# Patient Record
Sex: Female | Born: 1955
Health system: Southern US, Community
[De-identification: ages and names within clinical notes are randomized; demographics above are authoritative.]

## PROBLEM LIST (undated history)

## (undated) DIAGNOSIS — I10 Essential (primary) hypertension: Secondary | ICD-10-CM

## (undated) DIAGNOSIS — K219 Gastro-esophageal reflux disease without esophagitis: Secondary | ICD-10-CM

## (undated) DIAGNOSIS — E039 Hypothyroidism, unspecified: Secondary | ICD-10-CM

## (undated) DIAGNOSIS — K259 Gastric ulcer, unspecified as acute or chronic, without hemorrhage or perforation: Secondary | ICD-10-CM

## (undated) DIAGNOSIS — N809 Endometriosis, unspecified: Secondary | ICD-10-CM

## (undated) DIAGNOSIS — G039 Meningitis, unspecified: Secondary | ICD-10-CM

## (undated) DIAGNOSIS — K5790 Diverticulosis of intestine, part unspecified, without perforation or abscess without bleeding: Secondary | ICD-10-CM

## (undated) DIAGNOSIS — K635 Polyp of colon: Secondary | ICD-10-CM

## (undated) HISTORY — DX: Diverticulosis of intestine, part unspecified, without perforation or abscess without bleeding: K57.90

## (undated) HISTORY — PX: BACK SURGERY: SHX140

## (undated) HISTORY — DX: Gastric ulcer, unspecified as acute or chronic, without hemorrhage or perforation: K25.9

## (undated) HISTORY — PX: DILATION AND CURETTAGE OF UTERUS: SHX78

## (undated) HISTORY — DX: Meningitis, unspecified: G03.9

## (undated) HISTORY — PX: HERNIA REPAIR: SHX51

## (undated) HISTORY — DX: Endometriosis, unspecified: N80.9

## (undated) HISTORY — PX: ABDOMINAL HYSTERECTOMY: SHX81

## (undated) HISTORY — PX: CARPAL TUNNEL RELEASE: SHX101

## (undated) HISTORY — PX: OTHER SURGICAL HISTORY: SHX169

## (undated) HISTORY — DX: Polyp of colon: K63.5

## (undated) HISTORY — PX: CHOLECYSTECTOMY: SHX55

## (undated) HISTORY — PX: BRAIN SURGERY: SHX531

---

## 1998-03-08 ENCOUNTER — Inpatient Hospital Stay (HOSPITAL_COMMUNITY): Admission: RE | Admit: 1998-03-08 | Discharge: 1998-03-09 | Payer: Self-pay | Admitting: Neurosurgery

## 1999-05-04 ENCOUNTER — Encounter: Payer: Self-pay | Admitting: Neurosurgery

## 1999-05-04 ENCOUNTER — Inpatient Hospital Stay (HOSPITAL_COMMUNITY): Admission: EM | Admit: 1999-05-04 | Discharge: 1999-05-06 | Payer: Self-pay | Admitting: Emergency Medicine

## 1999-05-04 ENCOUNTER — Encounter: Payer: Self-pay | Admitting: Emergency Medicine

## 1999-05-06 ENCOUNTER — Encounter: Payer: Self-pay | Admitting: Neurosurgery

## 1999-05-09 ENCOUNTER — Inpatient Hospital Stay (HOSPITAL_COMMUNITY): Admission: RE | Admit: 1999-05-09 | Discharge: 1999-05-16 | Payer: Self-pay | Admitting: Neurosurgery

## 2001-06-13 ENCOUNTER — Other Ambulatory Visit: Admission: RE | Admit: 2001-06-13 | Discharge: 2001-06-13 | Payer: Self-pay | Admitting: Obstetrics and Gynecology

## 2002-08-21 ENCOUNTER — Other Ambulatory Visit: Admission: RE | Admit: 2002-08-21 | Discharge: 2002-08-21 | Payer: Self-pay | Admitting: Gynecology

## 2002-08-25 ENCOUNTER — Encounter: Admission: RE | Admit: 2002-08-25 | Discharge: 2002-08-25 | Payer: Self-pay | Admitting: Gynecology

## 2002-08-25 ENCOUNTER — Encounter: Payer: Self-pay | Admitting: Gynecology

## 2003-08-28 ENCOUNTER — Other Ambulatory Visit: Admission: RE | Admit: 2003-08-28 | Discharge: 2003-08-28 | Payer: Self-pay | Admitting: Gynecology

## 2005-01-13 ENCOUNTER — Ambulatory Visit: Payer: Self-pay | Admitting: Internal Medicine

## 2005-03-29 ENCOUNTER — Encounter: Admission: RE | Admit: 2005-03-29 | Discharge: 2005-03-29 | Payer: Self-pay | Admitting: Sports Medicine

## 2005-12-29 ENCOUNTER — Ambulatory Visit (HOSPITAL_COMMUNITY): Admission: RE | Admit: 2005-12-29 | Discharge: 2005-12-29 | Payer: Self-pay | Admitting: Family Medicine

## 2006-02-03 ENCOUNTER — Other Ambulatory Visit: Admission: RE | Admit: 2006-02-03 | Discharge: 2006-02-03 | Payer: Self-pay | Admitting: Gynecology

## 2006-02-26 ENCOUNTER — Encounter: Admission: RE | Admit: 2006-02-26 | Discharge: 2006-02-26 | Payer: Self-pay | Admitting: Gynecology

## 2006-03-19 ENCOUNTER — Ambulatory Visit (HOSPITAL_COMMUNITY): Admission: RE | Admit: 2006-03-19 | Discharge: 2006-03-19 | Payer: Self-pay | Admitting: *Deleted

## 2006-06-23 ENCOUNTER — Ambulatory Visit (HOSPITAL_COMMUNITY): Admission: RE | Admit: 2006-06-23 | Discharge: 2006-06-23 | Payer: Self-pay | Admitting: Family Medicine

## 2006-06-24 ENCOUNTER — Ambulatory Visit (HOSPITAL_COMMUNITY): Admission: RE | Admit: 2006-06-24 | Discharge: 2006-06-24 | Payer: Self-pay | Admitting: Family Medicine

## 2008-02-29 ENCOUNTER — Ambulatory Visit (HOSPITAL_COMMUNITY): Admission: RE | Admit: 2008-02-29 | Discharge: 2008-02-29 | Payer: Self-pay | Admitting: Obstetrics & Gynecology

## 2009-07-31 ENCOUNTER — Ambulatory Visit (HOSPITAL_COMMUNITY): Admission: RE | Admit: 2009-07-31 | Discharge: 2009-07-31 | Payer: Self-pay | Admitting: Obstetrics & Gynecology

## 2010-08-26 ENCOUNTER — Ambulatory Visit (HOSPITAL_COMMUNITY)
Admission: RE | Admit: 2010-08-26 | Discharge: 2010-08-26 | Payer: Self-pay | Source: Home / Self Care | Attending: Obstetrics & Gynecology | Admitting: Obstetrics & Gynecology

## 2010-08-31 DIAGNOSIS — K635 Polyp of colon: Secondary | ICD-10-CM

## 2010-08-31 HISTORY — DX: Polyp of colon: K63.5

## 2010-09-21 ENCOUNTER — Encounter: Payer: Self-pay | Admitting: Gynecology

## 2011-01-16 NOTE — Op Note (Signed)
[name], Rebecca Burke              ACCOUNT NO.:  [id number]   MEDICAL RECORD NO.:  [id number]          PATIENT TYPE:  AMB   LOCATION:  SDS                          FACILITY:  [facility]   PHYSICIAN:  Alfonse Ras, MD   DATE OF BIRTH:  28-Nov-1955   DATE OF PROCEDURE:  03/19/2006  DATE OF DISCHARGE:  03/19/2006                                 OPERATIVE REPORT   SURGEON:  Alfonse Ras, MD.   PREOPERATIVE DIAGNOSIS:  Umbilical hernia.   POSTOPERATIVE DIAGNOSIS:  Umbilical hernia.   PROCEDURE:  Umbilical hernia repair.   NOTE:  This is a repeat dictation from 03/19/06; repeat dictation on 04/14/06.   ANESTHESIA:  General.   DESCRIPTION:  The patient was taken to the operating room, placed in a  supine position.  After adequate general anesthesia was induced and he was  in a laryngeal mask, a transverse infraumbilical incision was made.  The  hernia sac was dissected free from the fascial edges and reduced into the  abdominal cavity, and the fascia was closed with interrupted #1 Surgilon.  The defect was no bigger than my small finger; and, therefore, I did not  apply any mesh.  The skin was closed with subcuticular 4-0 Monocryl.  Steri-  Strips and sterile dressings were applied.  The patient tolerated the  procedure well and went to the PACU in good condition.      Alfonse Ras, MD  Electronically Signed     KRE/MEDQ  D:  04/14/2006  T:  04/14/2006  Job:  [id number]

## 2011-01-16 NOTE — Op Note (Signed)
Rebecca Burke, Rebecca Burke              ACCOUNT NO.:  [id number]   MEDICAL RECORD NO.:  [id number]          PATIENT TYPE:  AMB   LOCATION:  SDS                          FACILITY:  [facility]   PHYSICIAN:  Alfonse Ras, MD   DATE OF BIRTH:  Jun 12, 1956   DATE OF PROCEDURE:  03/19/2006  DATE OF DISCHARGE:                                 OPERATIVE REPORT   PREOPERATIVE DIAGNOSIS:  Umbilical hernia.   POSTOPERATIVE DIAGNOSIS:  Umbilical hernia.   PROCEDURE:  Umbilical hernia repair.   SURGEON:  Alfonse Ras, M.D.   ANESTHESIA:  General.   DESCRIPTION:  The patient was taken to the operating room, placed in the  supine position after adequate anesthesia was induced.  The abdomen was  prepped and draped in normal sterile fashion.  Through the previous scar in  the lower abdomen just under the umbilicus a transverse incision was made.  Hernia sac was immediately encountered and was dissected off the fascia.  The fascial defect actually barely allowed my fifth finger tip through it  was so small.  The omentum was carefully reduced into the abdominal cavity,  and the fascia was closed with two figure-of-eight 0 Surgilon sutures.  I  did not think that with the size of this defect she needed mesh, and  therefore, the skin was closed primarily with 4-0 running Monocryl  subcuticularly.  Sterile dressing was applied.  The patient tolerated the  procedure well and went to PACU in good condition.      Alfonse Ras, MD  Electronically Signed     KRE/MEDQ  D:  03/19/2006  T:  03/19/2006  Job:  [id number]

## 2011-07-21 ENCOUNTER — Other Ambulatory Visit: Payer: Self-pay | Admitting: Obstetrics & Gynecology

## 2011-07-21 DIAGNOSIS — Z139 Encounter for screening, unspecified: Secondary | ICD-10-CM

## 2011-08-05 ENCOUNTER — Encounter: Payer: Self-pay | Admitting: Emergency Medicine

## 2011-08-05 ENCOUNTER — Emergency Department (HOSPITAL_COMMUNITY)
Admission: EM | Admit: 2011-08-05 | Discharge: 2011-08-06 | Disposition: A | Discharge status: Home / Self Care | Payer: BC Managed Care – PPO | Attending: Emergency Medicine | Admitting: Emergency Medicine

## 2011-08-05 DIAGNOSIS — K529 Noninfective gastroenteritis and colitis, unspecified: Secondary | ICD-10-CM

## 2011-08-05 DIAGNOSIS — E876 Hypokalemia: Secondary | ICD-10-CM | POA: Insufficient documentation

## 2011-08-05 DIAGNOSIS — R7401 Elevation of levels of liver transaminase levels: Secondary | ICD-10-CM | POA: Insufficient documentation

## 2011-08-05 DIAGNOSIS — N390 Urinary tract infection, site not specified: Secondary | ICD-10-CM | POA: Insufficient documentation

## 2011-08-05 DIAGNOSIS — R109 Unspecified abdominal pain: Secondary | ICD-10-CM | POA: Insufficient documentation

## 2011-08-05 DIAGNOSIS — R7402 Elevation of levels of lactic acid dehydrogenase (LDH): Secondary | ICD-10-CM | POA: Insufficient documentation

## 2011-08-05 DIAGNOSIS — K5289 Other specified noninfective gastroenteritis and colitis: Secondary | ICD-10-CM | POA: Insufficient documentation

## 2011-08-05 HISTORY — DX: Essential (primary) hypertension: I10

## 2011-08-05 NOTE — ED Notes (Signed)
Patient c/o abdominal cramping with N/V/D.  States was seen at Urgent Care today and told she had a stomach virus.  Patient c/o continued pain and cramping in her abdomen with associated nausea.

## 2011-08-06 ENCOUNTER — Emergency Department (HOSPITAL_COMMUNITY): Payer: BC Managed Care – PPO

## 2011-08-06 LAB — CBC
HCT: 39.6 % (ref 36.0–46.0)
Hemoglobin: 13.5 g/dL (ref 12.0–15.0)
MCV: 84.8 fL (ref 78.0–100.0)
RDW: 13.1 % (ref 11.5–15.5)
WBC: 8 10*3/uL (ref 4.0–10.5)

## 2011-08-06 LAB — COMPREHENSIVE METABOLIC PANEL
BUN: 23 mg/dL (ref 6–23)
CO2: 25 mEq/L (ref 19–32)
Calcium: 9.1 mg/dL (ref 8.4–10.5)
Creatinine, Ser: 0.68 mg/dL (ref 0.50–1.10)
GFR calc Af Amer: >90 mL/min (ref >90)
GFR calc non Af Amer: >90 mL/min (ref >90)
Glucose, Bld: 176 mg/dL — ABNORMAL HIGH (ref 70–99)
Total Bilirubin: 0.5 mg/dL (ref 0.3–1.2)

## 2011-08-06 LAB — DIFFERENTIAL
Basophils Absolute: 0 10*3/uL (ref 0.0–0.1)
Eosinophils Relative: 0 % (ref 0–5)
Lymphocytes Relative: 16 % (ref 12–46)
Lymphs Abs: 1.3 10*3/uL (ref 0.7–4.0)
Monocytes Absolute: 0.6 10*3/uL (ref 0.1–1.0)
Monocytes Relative: 8 % (ref 3–12)

## 2011-08-06 LAB — URINALYSIS, ROUTINE W REFLEX MICROSCOPIC
Glucose, UA: NEGATIVE mg/dL
Ketones, ur: NEGATIVE mg/dL
Protein, ur: NEGATIVE mg/dL

## 2011-08-06 LAB — LIPASE, BLOOD: Lipase: 17 U/L (ref 11–59)

## 2011-08-06 LAB — URINE MICROSCOPIC-ADD ON

## 2011-08-06 LAB — LACTIC ACID, PLASMA: Lactic Acid, Venous: 1.4 mmol/L (ref 0.5–2.2)

## 2011-08-06 MED ORDER — SODIUM CHLORIDE 0.9 % IV BOLUS (SEPSIS)
1000.0000 mL | Freq: Once | INTRAVENOUS | Status: AC
  Administered 2011-08-06: 1000 mL via INTRAVENOUS

## 2011-08-06 MED ORDER — POTASSIUM CHLORIDE ER 10 MEQ PO TBCR: 10.0000 meq | EXTENDED_RELEASE_TABLET | Freq: Two times a day (BID) | ORAL | Status: DC

## 2011-08-06 MED ORDER — ONDANSETRON HCL 4 MG PO TABS: 4.0000 mg | ORAL_TABLET | Freq: Four times a day (QID) | ORAL | Status: AC

## 2011-08-06 MED ORDER — MORPHINE SULFATE 4 MG/ML IJ SOLN
4.0000 mg | Freq: Once | INTRAMUSCULAR | Status: AC
  Administered 2011-08-06: 4 mg via INTRAVENOUS
  Filled 2011-08-06: qty 1

## 2011-08-06 MED ORDER — IOHEXOL 300 MG/ML  SOLN
100.0000 mL | Freq: Once | INTRAMUSCULAR | Status: AC | PRN
  Administered 2011-08-06: 100 mL via INTRAVENOUS

## 2011-08-06 MED ORDER — CEPHALEXIN 500 MG PO CAPS: 500.0000 mg | ORAL_CAPSULE | Freq: Four times a day (QID) | ORAL | Status: AC

## 2011-08-06 MED ORDER — TRAMADOL HCL 50 MG PO TABS: 50.0000 mg | ORAL_TABLET | Freq: Four times a day (QID) | ORAL | Status: AC | PRN

## 2011-08-06 MED ORDER — HYDROMORPHONE HCL PF 1 MG/ML IJ SOLN
1.0000 mg | Freq: Once | INTRAMUSCULAR | Status: AC
  Administered 2011-08-06: 1 mg via INTRAVENOUS
  Filled 2011-08-06: qty 1

## 2011-08-06 MED ORDER — ONDANSETRON HCL 4 MG/2ML IJ SOLN
4.0000 mg | Freq: Once | INTRAMUSCULAR | Status: AC
  Administered 2011-08-06: 4 mg via INTRAVENOUS
  Filled 2011-08-06: qty 2

## 2011-08-06 NOTE — ED Notes (Signed)
MD at bedside. 

## 2011-08-06 NOTE — ED Notes (Signed)
Medicated as ordered per MD. Denies any needs at this time. Husband at bedside. Call bell within reach. Bed in low position and locked with side rails up.

## 2011-08-06 NOTE — ED Notes (Signed)
Patient back to room from radiology. Pain 0\10. Denies nausea. Normal saline complete. Iv saline locked with no signs of infiltration. Call bell and husband at bedside. Will continue to monitor.

## 2011-08-06 NOTE — ED Notes (Signed)
Remains resting in bed on right side. Notified no new medication orders have been written but MD is aware. Verbalized understanding. Denies any needs. Call bell within reach. Will continue to monitor.

## 2011-08-06 NOTE — ED Notes (Signed)
Given one cup water for PO challenge. States pain is about a 2\10. Denies nausea. Denies any needs at this time. Call bell within reach. Will continue to monitor.

## 2011-08-06 NOTE — ED Notes (Signed)
Remains resting sitting up in bed. Denies pain or nausea. Call bell within reach. Husband with patient. Equal chest rise and fall. Denies needs.

## 2011-08-06 NOTE — ED Notes (Signed)
Tolerated PO challenge well. Resting comfortably on right side. Equal chest rise and fall. No distress. Call bell within reach.

## 2011-08-06 NOTE — ED Notes (Signed)
Into room to attempt iv access and draw labs. 

## 2011-08-06 NOTE — ED Notes (Signed)
Patient ambulatory to bathroom without assistance to obtain urine specimen via clean catch.

## 2011-08-06 NOTE — ED Notes (Signed)
Pain remains 7\10. Denies nausea. Resting in bed on right side. Denies any needs other than something else for pain. Call bell and husband at bedside. Will continue to monitor. md aware.

## 2011-08-06 NOTE — ED Notes (Signed)
Patient to radiology via stretcher

## 2011-08-06 NOTE — ED Notes (Signed)
Remains resting in bed on back. Eyes closed. No facial grimaces. Equal chest rise and fall. Call bell within reach. No distress. Husband at bedside.

## 2011-08-06 NOTE — ED Notes (Signed)
Into room to assess patient. Resting in bed on right side. States she has been having middle upper abdominal pain since Tuesday. Tuesday she had two episodes of vomiting but no more to follow. Has also had frequent episodes of diarrhea. Bowel sounds active in all quadrants. Tenderness upon palpation of middle upper abdomen. States it is a cramping\ spasming pain that comes and goes. Tonight was her last BM which was loose. Last food\ fluid intake at 1930 and tolerated well. Denies any needs at this time. Awaiting MD eval.

## 2011-08-06 NOTE — ED Provider Notes (Signed)
History     CSN: [id number] Arrival date & time: 08/05/2011 11:38 PM   First MD Initiated Contact with Patient 08/05/11 2355      Chief Complaint  Patient presents with  . Abdominal Cramping    (Consider location/radiation/quality/duration/timing/severity/associated sxs/prior treatment) HPI Comments: Patient presents with 2 days of abdominal cramping, nausea, vomiting diarrhea. She was seen in urgent care today and told that she had a stomach virus. She returns with worsening abdominal pain and nausea. She's not vomited for more than 24 hours but she still having frequent loose stools. She denies any chest pain, shortness of breath, fever chills. She has any urinary or vaginal symptoms. She denies any sick contacts.  The history is provided by the patient.    Past Medical History  Diagnosis Date  . Diabetes mellitus   . Hypertension     Past Surgical History  Procedure Date  . Back surgery   . Brain surgery   . Abdominal hysterectomy   . Cholecystectomy   . Hernia repair     No family history on file.  History  Substance Use Topics  . Smoking status: Former Games developer  . Smokeless tobacco: Not on file  . Alcohol Use: No    OB History    Grav Para Term Preterm Abortions TAB SAB Ect Mult Living                  Review of Systems  All other systems reviewed and are negative.    Allergies  Codeine and Sulfa antibiotics  Home Medications   Current Outpatient Rx  Name Route Sig Dispense Refill  . ACETAMINOPHEN 500 MG PO TABS Oral Take 500 mg by mouth as needed. For pain     . AMLODIPINE BESYLATE 10 MG PO TABS Oral Take 10 mg by mouth daily.      Marland Kitchen DIPHENOXYLATE-ATROPINE 2.5-0.025 MG PO TABS Oral Take 1 tablet by mouth 4 (four) times daily as needed. For diarrhea     . IBUPROFEN 200 MG PO TABS Oral Take 200 mg by mouth 3 (three) times daily as needed. For pain     . LOSARTAN POTASSIUM 100 MG PO TABS Oral Take 100 mg by mouth daily.      Marland Kitchen METFORMIN HCL 500 MG PO  TABS Oral Take 500 mg by mouth 2 (two) times daily with a meal.      . MONTELUKAST SODIUM 10 MG PO TABS Oral Take 10 mg by mouth at bedtime.      . OMEPRAZOLE 20 MG PO CPDR Oral Take 20 mg by mouth daily.      . CEPHALEXIN 500 MG PO CAPS Oral Take 1 capsule (500 mg total) by mouth 4 (four) times daily. 40 capsule 0  . ONDANSETRON HCL 4 MG PO TABS Oral Take 1 tablet (4 mg total) by mouth every 6 (six) hours. 12 tablet 0  . POTASSIUM CHLORIDE CR 10 MEQ PO TBCR Oral Take 1 tablet (10 mEq total) by mouth 2 (two) times daily. 6 tablet 0  . TRAMADOL HCL 50 MG PO TABS Oral Take 1 tablet (50 mg total) by mouth every 6 (six) hours as needed for pain. Maximum dose= 8 tablets per day 15 tablet 0    BP 113/68  Pulse 76  Temp(Src) 98.1 F (36.7 C) (Oral)  Resp 16  Ht 5\' 5"  (1.651 m)  Wt 212 lb (96.163 kg)  BMI 35.28 kg/m2  SpO2 96%  Physical Exam  Constitutional: She is oriented  to person, place, and time. She appears well-developed and well-nourished. No distress.  HENT:  Head: Normocephalic and atraumatic.  Mouth/Throat: Oropharynx is clear and moist.  Eyes: Conjunctivae are normal. Pupils are equal, round, and reactive to light.  Neck: Normal range of motion.  Cardiovascular: Normal rate, regular rhythm and normal heart sounds.   Pulmonary/Chest: Effort normal and breath sounds normal. No respiratory distress.  Abdominal: Soft. There is tenderness. There is no rebound and no guarding.       Diffusely tender with guarding  Musculoskeletal: Normal range of motion. She exhibits no edema and no tenderness.  Neurological: She is alert and oriented to person, place, and time. No cranial nerve deficit.  Skin: Skin is warm.    ED Course  Procedures (including critical care time)  Labs Reviewed  URINALYSIS, ROUTINE W REFLEX MICROSCOPIC - Abnormal; Notable for the following:    Color, Urine AMBER (*) BIOCHEMICALS MAY BE AFFECTED BY COLOR   Specific Gravity, Urine >1.030 (*)    Hgb urine  dipstick TRACE (*)    Bilirubin Urine SMALL (*)    All other components within normal limits  COMPREHENSIVE METABOLIC PANEL - Abnormal; Notable for the following:    Potassium 3.2 (*)    Glucose, Bld 176 (*)    AST 225 (*)    ALT 165 (*)    All other components within normal limits  URINE MICROSCOPIC-ADD ON - Abnormal; Notable for the following:    Squamous Epithelial / LPF FEW (*)    Bacteria, UA MANY (*)    All other components within normal limits  CBC  DIFFERENTIAL  LIPASE, BLOOD  LACTIC ACID, PLASMA   Ct Abdomen Pelvis W Contrast  08/06/2011  *RADIOLOGY REPORT*  Clinical Data: Abdominal pain, vomiting and diarrhea; abdominal cramping.  CT ABDOMEN AND PELVIS WITH CONTRAST  Technique:  Multidetector CT imaging of the abdomen and pelvis was performed following the standard protocol during bolus administration of intravenous contrast.  Contrast: OMNIPAQUE IOHEXOL 300 MG/ML IV SOLN  Comparison: None.  Findings: Mild bibasilar atelectasis is noted.  The diffusely decreased attenuation of the liver, even in the presence of contrast, is suspicious for diffuse fatty infiltration. The liver is otherwise unremarkable in appearance.  The spleen is normal in appearance.  The pancreas and adrenal glands are unremarkable.  Nonspecific perinephric stranding is noted bilaterally.  The kidneys are otherwise unremarkable in appearance.  There is no evidence of hydronephrosis.  No renal or ureteral stones are seen.  No free fluid is identified.  The small bowel is unremarkable in appearance.  The stomach is filled with contrast and is within normal limits.  No acute vascular abnormalities are seen.  The appendix is normal in caliber, without evidence for appendicitis.  The colon is partially filled with stool and is unremarkable in appearance.  The bladder is mildly distended appears grossly unremarkable.  The patient is status post hysterectomy; no suspicious adnexal masses are seen.  The ovaries appear  relatively symmetric.  No inguinal lymphadenopathy is seen.  No acute osseous abnormalities are identified.  Subcortical cystic change at the left femoral head reflects chronic degenerative change.  IMPRESSION:  1.  No acute abnormalities identified within the abdomen or pelvis. 2.  Diffuse fatty infiltration of the liver. 3.  Mild degenerative change noted at the left hip, without significant joint space narrowing. 4.  Mild bibasilar atelectasis noted.  Original Report Authenticated By: Tonia Ghent, M.D.     1. Gastroenteritis   2.  Hypokalemia   3. Urinary tract infection       MDM  Abdominal pain, nausea, vomiting, diarrhea. Vital Signs stable abdomen is soft but diffusely tender.  We'll obtain labs, treat symptoms and consider imaging.  Concentrated urine with many bacteria. Labs remarkable for slight elevation of transaminases, slight hypokalemia. CT negative for acute process   Patient tolerating by mouth without difficulty. We'll discharge with antiemetics, potassium replacement and UTI treatment.  Glynn Octave, MD 08/06/11 [phone]

## 2011-08-06 NOTE — ED Notes (Signed)
Denies any pain. Denies nausea. Sitting up in bed. Tolerating contrast well. Call bell within reach. Denies any needs. No distress.

## 2011-08-06 NOTE — ED Notes (Signed)
Medicated as ordered for 7\10 pain. Patient sitting up drinking contrast. Tolerating well. Denies nausea. Call bell within reach. Husband at bedside.

## 2011-08-12 ENCOUNTER — Telehealth: Payer: Self-pay

## 2011-08-13 ENCOUNTER — Other Ambulatory Visit: Payer: Self-pay

## 2011-08-13 DIAGNOSIS — Z139 Encounter for screening, unspecified: Secondary | ICD-10-CM

## 2011-08-13 NOTE — Telephone Encounter (Signed)
Rx and instructions mailed.  

## 2011-08-13 NOTE — Telephone Encounter (Signed)
Gastroenterology Pre-Procedure Form  Pt trying very hard to get colonoscopy before end of the year. This is late afternoon appt. Can we change her  Prep instructions for diabetic .   Request Date: 08/28/2011      Requesting Physician: Dr. Regino Schultze    PATIENT INFORMATION:  Rebecca Burke is a 55 y.o., female (DOB=01/01/1956).  PROCEDURE: Procedure(s) requested: colonoscopy Procedure Reason: screening for colon cancer  PATIENT REVIEW QUESTIONS: The patient reports the following:   1. Diabetes Melitis: yes  2. Joint replacements in the past 12 months: no 3. Major health problems in the past 3 months: no 4. Has an artificial valve or MVP:no 5. Has been advised in past to take antibiotics in advance of a procedure like teeth cleaning: no}    MEDICATIONS & ALLERGIES:    Patient reports the following regarding taking any blood thinners:   Plavix? no Aspirin?no Coumadin?  no  Patient confirms/reports the following medications:  Current Outpatient Prescriptions  Medication Sig Dispense Refill  . amLODipine (NORVASC) 10 MG tablet Take 10 mg by mouth daily.        . cephALEXin (KEFLEX) 500 MG capsule Take 1 capsule (500 mg total) by mouth 4 (four) times daily.  40 capsule  0  . ibuprofen (ADVIL,MOTRIN) 200 MG tablet Take 200 mg by mouth 3 (three) times daily as needed. For pain       . losartan (COZAAR) 100 MG tablet Take 100 mg by mouth daily.        . metFORMIN (GLUCOPHAGE) 500 MG tablet Take 500 mg by mouth 2 (two) times daily with a meal.        . omeprazole (PRILOSEC) 20 MG capsule Take 20 mg by mouth daily.        Marland Kitchen acetaminophen (TYLENOL) 500 MG tablet Take 500 mg by mouth as needed. For pain       . diphenoxylate-atropine (LOMOTIL) 2.5-0.025 MG per tablet Take 1 tablet by mouth 4 (four) times daily as needed. For diarrhea       . montelukast (SINGULAIR) 10 MG tablet Take 10 mg by mouth at bedtime.        . ondansetron (ZOFRAN) 4 MG tablet Take 1 tablet (4 mg total) by mouth  every 6 (six) hours.  12 tablet  0  . potassium chloride (K-DUR) 10 MEQ tablet Take 1 tablet (10 mEq total) by mouth 2 (two) times daily.  6 tablet  0  . traMADol (ULTRAM) 50 MG tablet Take 1 tablet (50 mg total) by mouth every 6 (six) hours as needed for pain. Maximum dose= 8 tablets per day  15 tablet  0    Patient confirms/reports the following allergies:  Allergies  Allergen Reactions  . Codeine   . Sulfa Antibiotics     Patient is appropriate to schedule for requested procedure(s): yes  AUTHORIZATION INFORMATION Primary Insurance:   ID #:   Group #:  Pre-Cert / Auth required: } Pre-Cert / Auth #:   Secondary Insurance:   ID #:  Group #:  Pre-Cert / Auth required Pre-Cert / Auth #:   No orders of the defined types were placed in this encounter.    SCHEDULE INFORMATION: Procedure has been scheduled as follows:  Date: 08/28/2011       Time: 2:45 PM  Location: [facility] Short Stay  This Gastroenterology Pre-Precedure Form is being routed to the following provider(s) for review: R. Roetta Sessions, MD

## 2011-08-13 NOTE — Telephone Encounter (Signed)
OK to proceed with colonoscopy.

## 2011-08-18 ENCOUNTER — Encounter (HOSPITAL_COMMUNITY): Payer: Self-pay | Admitting: Pharmacy Technician

## 2011-08-27 ENCOUNTER — Telehealth: Payer: Self-pay | Admitting: Gastroenterology

## 2011-08-27 MED ORDER — SODIUM CHLORIDE 0.45 % IV SOLN
Freq: Once | INTRAVENOUS | Status: AC
  Administered 2011-08-28: 10:00:00 via INTRAVENOUS

## 2011-08-27 NOTE — Telephone Encounter (Signed)
PT PROCEDURE TIME MOVED UP TO 10AM- PT AWARE OF NEW ARRIVAL TIME

## 2011-08-28 ENCOUNTER — Ambulatory Visit (HOSPITAL_COMMUNITY)
Admission: RE | Admit: 2011-08-28 | Discharge: 2011-08-28 | Disposition: A | Discharge status: Home / Self Care | Payer: BC Managed Care – PPO | Source: Ambulatory Visit | Attending: Internal Medicine | Admitting: Internal Medicine

## 2011-08-28 ENCOUNTER — Encounter (HOSPITAL_COMMUNITY)
Admission: RE | Disposition: A | Discharge status: Home / Self Care | Payer: Self-pay | Source: Ambulatory Visit | Attending: Internal Medicine

## 2011-08-28 ENCOUNTER — Other Ambulatory Visit: Payer: Self-pay | Admitting: Internal Medicine

## 2011-08-28 ENCOUNTER — Encounter (HOSPITAL_COMMUNITY): Payer: Self-pay | Admitting: *Deleted

## 2011-08-28 DIAGNOSIS — D126 Benign neoplasm of colon, unspecified: Secondary | ICD-10-CM | POA: Insufficient documentation

## 2011-08-28 DIAGNOSIS — E119 Type 2 diabetes mellitus without complications: Secondary | ICD-10-CM | POA: Insufficient documentation

## 2011-08-28 DIAGNOSIS — Z1211 Encounter for screening for malignant neoplasm of colon: Secondary | ICD-10-CM

## 2011-08-28 DIAGNOSIS — Z139 Encounter for screening, unspecified: Secondary | ICD-10-CM

## 2011-08-28 DIAGNOSIS — I10 Essential (primary) hypertension: Secondary | ICD-10-CM | POA: Insufficient documentation

## 2011-08-28 HISTORY — DX: Gastro-esophageal reflux disease without esophagitis: K21.9

## 2011-08-28 HISTORY — PX: COLONOSCOPY: SHX5424

## 2011-08-28 SURGERY — COLONOSCOPY
Anesthesia: Moderate Sedation

## 2011-08-28 MED ORDER — MIDAZOLAM HCL 5 MG/5ML IJ SOLN
INTRAMUSCULAR | Status: AC
  Filled 2011-08-28: qty 10

## 2011-08-28 MED ORDER — MIDAZOLAM HCL 5 MG/5ML IJ SOLN
INTRAMUSCULAR | Status: DC | PRN
  Administered 2011-08-28: 2 mg via INTRAVENOUS
  Administered 2011-08-28: 1 mg via INTRAVENOUS

## 2011-08-28 MED ORDER — MEPERIDINE HCL 100 MG/ML IJ SOLN
INTRAMUSCULAR | Status: AC
  Filled 2011-08-28: qty 2

## 2011-08-28 MED ORDER — MEPERIDINE HCL 100 MG/ML IJ SOLN
INTRAMUSCULAR | Status: DC | PRN
  Administered 2011-08-28: 50 mg via INTRAVENOUS
  Administered 2011-08-28: 25 mg via INTRAVENOUS

## 2011-08-28 NOTE — Op Note (Signed)
[facility] [address] [place], [place]  [address]  COLONOSCOPY PROCEDURE REPORT  PATIENT:  Rebecca Burke, Rebecca Burke  MR#:  [id number] BIRTHDATE:  April 12, 1956, 55 yrs. old  GENDER:  female ENDOSCOPIST:  R. Roetta Sessions, MD FACP [facility] REF. BY:          Dr. Yetta Numbers PROCEDURE DATE:  08/28/2011 PROCEDURE:  colonoscopy with snare polypectomy  INDICATIONS:  first ever average risk screening colonoscopy  INFORMED CONSENT:  The risks, benefits, alternatives and imponderables including but not limited to bleeding, perforation as well as the possibility of a missed lesion have been reviewed. The potential for biopsy, lesion removal, etc. have also been discussed.  Questions have been answered.  All parties agreeable. Please see the history and physical in the medical record for more information.  MEDICATIONS:  Versed 3 mg IV and Demerol 75 mg IV  DESCRIPTION OF PROCEDURE:  After a digital rectal exam was performed, the EC-[id number] ([id number]) colonoscope was advanced from the anus through the rectum and colon to the area of the cecum, ileocecal valve and appendiceal orifice.  The cecum was deeply intubated.  These structures were well-seen and photographed for the record.  From the level of the cecum and ileocecal valve, the scope was slowly and cautiously withdrawn.  The mucosal surfaces were carefully surveyed utilizing scope tip deflection to facilitate fold flattening as needed.  The scope was pulled down into the rectum where a thorough examination including retroflexion was performed. <<PROCEDUREIMAGES>>  FINDINGS:   Suboptimal prep.. (1) 9 mm oval polyp at the splenic flexure; remainder of colonic and rectal mucosa appeared normal  THERAPEUTIC / DIAGNOSTIC MANEUVERS PERFORMED:  the single polyp mentioned above was hot snare removed cleanly with one pass.  COMPLICATIONS:  none  CECAL WITHDRAWAL TIME:  8 minutes  IMPRESSION:  Single colonic polyp-removed as described  above.  RECOMMENDATIONS:   Follow up on pathology  ______________________________ R. Roetta Sessions, MD Caleen Essex  CC:  Karleen Hampshire, MD  n. eSIGNED:   R. Roetta Sessions at 08/28/2011 11:01 AM  Luci Bank, [phone]

## 2011-08-28 NOTE — H&P (Addendum)
Primary Care Physician:  Cassell Smiles., MD, MD Primary Gastroenterologist:  Dr. Jena Gauss  Pre-Procedure History & Physical: HPI:  Rebecca Burke is a 55 y.o. female is here for a screening colonoscopy. Chronic IBS symptoms. No hematochezia or symptoms. This is her first colonoscopy. No family history of polyps or colon cancer  Past Medical History  Diagnosis Date  . Diabetes mellitus   . Hypertension   . GERD (gastroesophageal reflux disease)     Past Surgical History  Procedure Date  . Back surgery   . Abdominal hysterectomy   . Cholecystectomy   . Hernia repair   . Brain surgery     Cerebro spinal fluid leak  . Left foot surgery   . Cesarean section     X 2  . Dilation and curettage of uterus     X 3    Prior to Admission medications   Medication Sig Start Date End Date Taking? Authorizing Provider  amLODipine (NORVASC) 10 MG tablet Take 10 mg by mouth daily.     Yes Historical Provider, MD  azithromycin (ZITHROMAX) 250 MG tablet Take 250 mg by mouth daily. Take 2 tablets on the first day, then take one tablet daily for days 2-5. Started on 08/14/11    Yes Historical Provider, MD  losartan (COZAAR) 100 MG tablet Take 100 mg by mouth daily.     Yes Historical Provider, MD  metFORMIN (GLUCOPHAGE) 500 MG tablet Take 500 mg by mouth 2 (two) times daily with a meal.     Yes Historical Provider, MD  montelukast (SINGULAIR) 10 MG tablet Take 10 mg by mouth at bedtime.     Yes Historical Provider, MD  Multiple Vitamins-Minerals (MULTIVITAMINS THER. W/MINERALS) TABS Take 1 tablet by mouth daily.     Yes Historical Provider, MD  omeprazole (PRILOSEC) 20 MG capsule Take 20 mg by mouth daily.     Yes Historical Provider, MD  naproxen (NAPROSYN) 500 MG tablet Take 500 mg by mouth 2 (two) times daily as needed. Pain     Historical Provider, MD    Allergies as of 08/13/2011 - Review Complete 08/12/2011  Allergen Reaction Noted  . Codeine  08/05/2011  . Sulfa antibiotics  08/05/2011     Family History  Problem Relation Age of Onset  . Colon cancer Neg Hx     History   Social History  . Marital Status: Married    Spouse Name: N/A    Number of Children: N/A  . Years of Education: N/A   Occupational History  . Not on file.   Social History Main Topics  . Smoking status: Former Smoker -- 1.0 packs/day for 29 years    Types: Cigarettes  . Smokeless tobacco: Not on file  . Alcohol Use: No  . Drug Use: No  . Sexually Active:    Other Topics Concern  . Not on file   Social History Narrative  . No narrative on file    Review of Systems: See HPI, otherwise negative ROS  Physical Exam: BP 141/91  Pulse 78  Temp(Src) 98.4 F (36.9 C) (Oral)  Resp 21  Ht 5\' 5"  (1.651 m)  Wt 210 lb (95.255 kg)  BMI 34.95 kg/m2  SpO2 100% General:   Alert,  Well-developed, well-nourished, pleasant and cooperative in NAD Head:  Normocephalic and atraumatic. Eyes:  Sclera clear, no icterus.   Conjunctiva pink. Ears:  Normal auditory acuity. Nose:  No deformity, discharge,  or lesions. Mouth:  No deformity or lesions,  dentition normal. Neck:  Supple; no masses or thyromegaly. Lungs:  Clear throughout to auscultation.   No wheezes, crackles, or rhonchi. No acute distress. Heart:  Regular rate and rhythm; no murmurs, clicks, rubs,  or gallops. Abdomen:  Obese Soft, nontender and nondistended. No masses, hepatosplenomegaly or hernias noted. Normal bowel sounds, without guarding, and without rebound.   Msk:  Symmetrical without gross deformities. Normal posture. Pulses:  Normal pulses noted. Extremities:  Without clubbing or edema. Neurologic:  Alert and  oriented x4;  grossly normal neurologically. Skin:  Intact without significant lesions or rashes. Cervical Nodes:  No significant cervical adenopathy. Psych:  Alert and cooperative. Normal mood and affect.  Impression/Plan: Rebecca Burke is now here to undergo a screening colonoscopy. First ever average risk  screening examination.  Risks, benefits, limitations, imponderables and alternatives regarding colonoscopy have been reviewed with the patient. Questions have been answered. All parties agreeable.

## 2011-08-31 ENCOUNTER — Ambulatory Visit (HOSPITAL_COMMUNITY)
Admission: RE | Admit: 2011-08-31 | Discharge: 2011-08-31 | Disposition: A | Discharge status: Home / Self Care | Payer: BC Managed Care – PPO | Source: Ambulatory Visit | Attending: Obstetrics & Gynecology | Admitting: Obstetrics & Gynecology

## 2011-08-31 DIAGNOSIS — Z139 Encounter for screening, unspecified: Secondary | ICD-10-CM

## 2011-08-31 DIAGNOSIS — Z1231 Encounter for screening mammogram for malignant neoplasm of breast: Secondary | ICD-10-CM | POA: Insufficient documentation

## 2011-09-08 ENCOUNTER — Encounter (HOSPITAL_COMMUNITY): Payer: Self-pay | Admitting: Internal Medicine

## 2012-01-13 ENCOUNTER — Encounter (INDEPENDENT_AMBULATORY_CARE_PROVIDER_SITE_OTHER): Payer: Managed Care, Other (non HMO) | Admitting: Ophthalmology

## 2012-01-13 DIAGNOSIS — H318 Other specified disorders of choroid: Secondary | ICD-10-CM

## 2012-01-13 DIAGNOSIS — E11319 Type 2 diabetes mellitus with unspecified diabetic retinopathy without macular edema: Secondary | ICD-10-CM

## 2012-01-13 DIAGNOSIS — H43819 Vitreous degeneration, unspecified eye: Secondary | ICD-10-CM

## 2012-01-13 DIAGNOSIS — H2510 Age-related nuclear cataract, unspecified eye: Secondary | ICD-10-CM

## 2012-03-11 ENCOUNTER — Encounter (INDEPENDENT_AMBULATORY_CARE_PROVIDER_SITE_OTHER): Payer: Managed Care, Other (non HMO) | Admitting: Ophthalmology

## 2012-03-11 DIAGNOSIS — E1165 Type 2 diabetes mellitus with hyperglycemia: Secondary | ICD-10-CM

## 2012-03-11 DIAGNOSIS — H2510 Age-related nuclear cataract, unspecified eye: Secondary | ICD-10-CM

## 2012-03-11 DIAGNOSIS — H35039 Hypertensive retinopathy, unspecified eye: Secondary | ICD-10-CM

## 2012-03-11 DIAGNOSIS — E1139 Type 2 diabetes mellitus with other diabetic ophthalmic complication: Secondary | ICD-10-CM

## 2012-03-11 DIAGNOSIS — I10 Essential (primary) hypertension: Secondary | ICD-10-CM

## 2012-03-11 DIAGNOSIS — H43819 Vitreous degeneration, unspecified eye: Secondary | ICD-10-CM

## 2012-03-11 DIAGNOSIS — E11319 Type 2 diabetes mellitus with unspecified diabetic retinopathy without macular edema: Secondary | ICD-10-CM

## 2012-03-18 ENCOUNTER — Encounter (INDEPENDENT_AMBULATORY_CARE_PROVIDER_SITE_OTHER): Payer: Managed Care, Other (non HMO) | Admitting: Ophthalmology

## 2012-09-23 ENCOUNTER — Ambulatory Visit (INDEPENDENT_AMBULATORY_CARE_PROVIDER_SITE_OTHER): Payer: Managed Care, Other (non HMO) | Admitting: Ophthalmology

## 2012-09-23 DIAGNOSIS — H2510 Age-related nuclear cataract, unspecified eye: Secondary | ICD-10-CM

## 2012-09-23 DIAGNOSIS — H35039 Hypertensive retinopathy, unspecified eye: Secondary | ICD-10-CM

## 2012-09-23 DIAGNOSIS — E1165 Type 2 diabetes mellitus with hyperglycemia: Secondary | ICD-10-CM

## 2012-09-23 DIAGNOSIS — H43819 Vitreous degeneration, unspecified eye: Secondary | ICD-10-CM

## 2012-09-23 DIAGNOSIS — E1139 Type 2 diabetes mellitus with other diabetic ophthalmic complication: Secondary | ICD-10-CM

## 2012-09-23 DIAGNOSIS — H47219 Primary optic atrophy, unspecified eye: Secondary | ICD-10-CM

## 2012-09-23 DIAGNOSIS — I10 Essential (primary) hypertension: Secondary | ICD-10-CM

## 2012-09-23 DIAGNOSIS — E11319 Type 2 diabetes mellitus with unspecified diabetic retinopathy without macular edema: Secondary | ICD-10-CM

## 2012-10-05 ENCOUNTER — Other Ambulatory Visit (HOSPITAL_COMMUNITY): Payer: Self-pay | Admitting: Family Medicine

## 2012-10-05 DIAGNOSIS — Z139 Encounter for screening, unspecified: Secondary | ICD-10-CM

## 2012-10-13 ENCOUNTER — Ambulatory Visit (HOSPITAL_COMMUNITY): Payer: Managed Care, Other (non HMO)

## 2012-10-21 ENCOUNTER — Ambulatory Visit (HOSPITAL_COMMUNITY)
Admission: RE | Admit: 2012-10-21 | Discharge: 2012-10-21 | Disposition: A | Discharge status: Home / Self Care | Payer: Managed Care, Other (non HMO) | Source: Ambulatory Visit | Attending: Family Medicine | Admitting: Family Medicine

## 2012-10-21 DIAGNOSIS — Z1231 Encounter for screening mammogram for malignant neoplasm of breast: Secondary | ICD-10-CM | POA: Insufficient documentation

## 2012-10-21 DIAGNOSIS — Z139 Encounter for screening, unspecified: Secondary | ICD-10-CM

## 2013-02-16 ENCOUNTER — Emergency Department (HOSPITAL_COMMUNITY): Payer: Worker's Compensation

## 2013-02-16 ENCOUNTER — Encounter (HOSPITAL_COMMUNITY): Payer: Self-pay | Admitting: Emergency Medicine

## 2013-02-16 ENCOUNTER — Emergency Department (HOSPITAL_COMMUNITY)
Admission: EM | Admit: 2013-02-16 | Discharge: 2013-02-16 | Disposition: A | Discharge status: Home / Self Care | Payer: Worker's Compensation | Attending: Emergency Medicine | Admitting: Emergency Medicine

## 2013-02-16 DIAGNOSIS — E119 Type 2 diabetes mellitus without complications: Secondary | ICD-10-CM | POA: Insufficient documentation

## 2013-02-16 DIAGNOSIS — Y9289 Other specified places as the place of occurrence of the external cause: Secondary | ICD-10-CM | POA: Insufficient documentation

## 2013-02-16 DIAGNOSIS — Z792 Long term (current) use of antibiotics: Secondary | ICD-10-CM | POA: Insufficient documentation

## 2013-02-16 DIAGNOSIS — IMO0002 Reserved for concepts with insufficient information to code with codable children: Secondary | ICD-10-CM | POA: Insufficient documentation

## 2013-02-16 DIAGNOSIS — I10 Essential (primary) hypertension: Secondary | ICD-10-CM | POA: Insufficient documentation

## 2013-02-16 DIAGNOSIS — Z87891 Personal history of nicotine dependence: Secondary | ICD-10-CM | POA: Insufficient documentation

## 2013-02-16 DIAGNOSIS — Z79899 Other long term (current) drug therapy: Secondary | ICD-10-CM | POA: Insufficient documentation

## 2013-02-16 DIAGNOSIS — Y9389 Activity, other specified: Secondary | ICD-10-CM | POA: Insufficient documentation

## 2013-02-16 DIAGNOSIS — K219 Gastro-esophageal reflux disease without esophagitis: Secondary | ICD-10-CM | POA: Insufficient documentation

## 2013-02-16 DIAGNOSIS — Y990 Civilian activity done for income or pay: Secondary | ICD-10-CM | POA: Insufficient documentation

## 2013-02-16 DIAGNOSIS — S2239XA Fracture of one rib, unspecified side, initial encounter for closed fracture: Secondary | ICD-10-CM | POA: Insufficient documentation

## 2013-02-16 DIAGNOSIS — W2209XA Striking against other stationary object, initial encounter: Secondary | ICD-10-CM | POA: Insufficient documentation

## 2013-02-16 DIAGNOSIS — S2232XA Fracture of one rib, left side, initial encounter for closed fracture: Secondary | ICD-10-CM

## 2013-02-16 MED ORDER — FENTANYL CITRATE 0.05 MG/ML IJ SOLN
50.0000 ug | Freq: Once | INTRAMUSCULAR | Status: AC
  Administered 2013-02-16: 50 ug via INTRAVENOUS

## 2013-02-16 MED ORDER — FENTANYL CITRATE 0.05 MG/ML IJ SOLN
INTRAMUSCULAR | Status: AC
  Administered 2013-02-16: 100 ug via INTRAVENOUS
  Filled 2013-02-16: qty 2

## 2013-02-16 MED ORDER — OXYCODONE-ACETAMINOPHEN 5-325 MG PO TABS: 2.0000 | ORAL_TABLET | ORAL | Status: DC | PRN

## 2013-02-16 MED ORDER — FENTANYL CITRATE 0.05 MG/ML IJ SOLN: 100.0000 ug | Freq: Once | INTRAMUSCULAR | Status: AC

## 2013-02-16 NOTE — ED Notes (Signed)
Pt comes via EMS after a fall that occurred today at work. Pt states she slipped and hit her left side on a bench. Pt only reports pain in that area. Pt states "I felt a pop when I landed". Pt reports increased pain on inspiration. Pt denies difficulty breathing despite pain. Pt denies hitting head and denies LOC. Pt tender at injured area.

## 2013-02-16 NOTE — ED Notes (Signed)
Patient with no complaints at this time. Respirations even and unlabored. Skin warm/dry. Discharge instructions reviewed with patient at this time. Patient given opportunity to voice concerns/ask questions. IV removed per policy and band-aid applied to site. Patient discharged at this time and left Emergency Department with steady gait.  

## 2013-02-16 NOTE — ED Provider Notes (Signed)
History     CSN: [id number]  Arrival date & time 02/16/13  1339   First MD Initiated Contact with Patient 02/16/13 1345      Chief Complaint  Patient presents with  . Fall    (Consider location/radiation/quality/duration/timing/severity/associated sxs/prior treatment) HPI Pt states she sat back to sit on bench and missed bench coming down on her left side. No head or neck injury. + immediate pain. +popping sound. Pt c/o point tenderness in L lower thoracic region. No SOB. Mild increase in pain with deep breathing and raising left arm.  Past Medical History  Diagnosis Date  . Diabetes mellitus   . Hypertension   . GERD (gastroesophageal reflux disease)     Past Surgical History  Procedure Laterality Date  . Back surgery    . Abdominal hysterectomy    . Cholecystectomy    . Hernia repair    . Brain surgery      Cerebro spinal fluid leak  . Left foot surgery    . Cesarean section      X 2  . Dilation and curettage of uterus      X 3  . Colonoscopy  08/28/2011    Procedure: COLONOSCOPY;  Surgeon: Corbin Ade, MD;  Location: [facility];  Service: Endoscopy;  Laterality: N/A;  2:45 PM    Family History  Problem Relation Age of Onset  . Colon cancer Neg Hx     History  Substance Use Topics  . Smoking status: Former Smoker -- 1.00 packs/day for 29 years    Types: Cigarettes  . Smokeless tobacco: Not on file  . Alcohol Use: No    OB History   Grav Para Term Preterm Abortions TAB SAB Ect Mult Living                  Review of Systems  Constitutional: Negative for fever and chills.  HENT: Negative for neck pain.   Respiratory: Negative for shortness of breath.   Cardiovascular: Negative for chest pain.  Gastrointestinal: Negative for nausea, vomiting and abdominal pain.  Musculoskeletal: Positive for back pain and arthralgias. Negative for myalgias.  Skin: Negative for rash and wound.  Neurological: Negative for dizziness, syncope, weakness,  light-headedness, numbness and headaches.  All other systems reviewed and are negative.    Allergies  Other; Codeine; and Sulfa antibiotics  Home Medications   Current Outpatient Rx  Name  Route  Sig  Dispense  Refill  . amLODipine (NORVASC) 10 MG tablet   Oral   Take 10 mg by mouth daily.           Marland Kitchen losartan (COZAAR) 100 MG tablet   Oral   Take 100 mg by mouth daily.           . metFORMIN (GLUCOPHAGE) 500 MG tablet   Oral   Take 500 mg by mouth 2 (two) times daily with a meal.           . mometasone (NASONEX) 50 MCG/ACT nasal spray   Nasal   Place 2 sprays into the nose daily as needed (congestion).         . montelukast (SINGULAIR) 10 MG tablet   Oral   Take 10 mg by mouth at bedtime.           . Multiple Vitamins-Minerals (MULTIVITAMINS THER. W/MINERALS) TABS   Oral   Take 1 tablet by mouth daily.           . naproxen (NAPROSYN)  500 MG tablet   Oral   Take 500 mg by mouth 2 (two) times daily as needed. Pain          . omeprazole (PRILOSEC) 20 MG capsule   Oral   Take 20 mg by mouth daily.           . Levofloxacin (LEVAQUIN PO)   Oral   Take 1 tablet by mouth daily.         Marland Kitchen oxyCODONE-acetaminophen (PERCOCET) 5-325 MG per tablet   Oral   Take 2 tablets by mouth every 4 (four) hours as needed for pain.   20 tablet   0     BP 151/91  Pulse 84  Temp(Src) 97.9 F (36.6 C) (Oral)  Resp 22  Ht 5\' 5"  (1.651 m)  Wt 210 lb (95.255 kg)  BMI 34.95 kg/m2  SpO2 100%  Physical Exam  Nursing note and vitals reviewed. Constitutional: She is oriented to person, place, and time. She appears well-developed and well-nourished. No distress.  HENT:  Head: Normocephalic and atraumatic.  Mouth/Throat: Oropharynx is clear and moist.  Eyes: EOM are normal. Pupils are equal, round, and reactive to light.  Neck: Normal range of motion. Neck supple.  Cardiovascular: Normal rate and regular rhythm.   Pulmonary/Chest: Effort normal and breath sounds  normal. No respiratory distress. She has no wheezes. She has no rales. She exhibits no tenderness.  Abdominal: Soft. Bowel sounds are normal. She exhibits no distension and no mass. There is no tenderness. There is no rebound and no guarding.  Musculoskeletal: Normal range of motion. She exhibits tenderness (Point tenderness over L lower lateral rib. No contusion or obvious injury. ). She exhibits no edema.  Neurological: She is alert and oriented to person, place, and time.  5/5 motor in all ext, sensation intact  Skin: Skin is warm and dry. No rash noted. No erythema.  Psychiatric: She has a normal mood and affect. Her behavior is normal.    ED Course  Procedures (including critical care time)  Labs Reviewed - No data to display Dg Ribs Unilateral W/chest Left  02/16/2013   *RADIOLOGY REPORT*  Clinical Data: Fall  LEFT RIBS AND CHEST - 3+ VIEW  Comparison: None.  Findings: Probable nondisplaced fracture of left eighth rib laterally.  No other rib fractures identified.  There is mild left lower lobe atelectasis.  No effusion or pneumothorax.  Right lung is clear.  IMPRESSION: Probable nondisplaced fracture of left eighth rib.  Mild left lower lobe atelectasis.   Original Report Authenticated By: Janeece Riggers, M.D.     1. Rib fracture, left, closed, initial encounter       MDM          Loren Racer, MD 02/16/13 [phone]

## 2013-07-06 ENCOUNTER — Other Ambulatory Visit: Payer: Self-pay

## 2013-09-29 ENCOUNTER — Ambulatory Visit (INDEPENDENT_AMBULATORY_CARE_PROVIDER_SITE_OTHER): Payer: Managed Care, Other (non HMO) | Admitting: Ophthalmology

## 2013-10-06 ENCOUNTER — Ambulatory Visit (INDEPENDENT_AMBULATORY_CARE_PROVIDER_SITE_OTHER): Payer: Self-pay | Admitting: Ophthalmology

## 2013-10-29 ENCOUNTER — Emergency Department (HOSPITAL_COMMUNITY)
Admission: EM | Admit: 2013-10-29 | Discharge: 2013-10-30 | Disposition: A | Discharge status: Home / Self Care | Payer: Managed Care, Other (non HMO) | Attending: Emergency Medicine | Admitting: Emergency Medicine

## 2013-10-29 ENCOUNTER — Encounter (HOSPITAL_COMMUNITY): Payer: Self-pay | Admitting: Emergency Medicine

## 2013-10-29 DIAGNOSIS — IMO0002 Reserved for concepts with insufficient information to code with codable children: Secondary | ICD-10-CM | POA: Insufficient documentation

## 2013-10-29 DIAGNOSIS — E119 Type 2 diabetes mellitus without complications: Secondary | ICD-10-CM | POA: Insufficient documentation

## 2013-10-29 DIAGNOSIS — K219 Gastro-esophageal reflux disease without esophagitis: Secondary | ICD-10-CM | POA: Insufficient documentation

## 2013-10-29 DIAGNOSIS — K529 Noninfective gastroenteritis and colitis, unspecified: Secondary | ICD-10-CM

## 2013-10-29 DIAGNOSIS — Z87891 Personal history of nicotine dependence: Secondary | ICD-10-CM | POA: Insufficient documentation

## 2013-10-29 DIAGNOSIS — Z79899 Other long term (current) drug therapy: Secondary | ICD-10-CM | POA: Insufficient documentation

## 2013-10-29 DIAGNOSIS — Z90710 Acquired absence of both cervix and uterus: Secondary | ICD-10-CM | POA: Insufficient documentation

## 2013-10-29 DIAGNOSIS — I10 Essential (primary) hypertension: Secondary | ICD-10-CM | POA: Insufficient documentation

## 2013-10-29 DIAGNOSIS — K5289 Other specified noninfective gastroenteritis and colitis: Secondary | ICD-10-CM | POA: Insufficient documentation

## 2013-10-29 DIAGNOSIS — Z9889 Other specified postprocedural states: Secondary | ICD-10-CM | POA: Insufficient documentation

## 2013-10-29 LAB — CBC WITH DIFFERENTIAL/PLATELET
BASOS ABS: 0 10*3/uL (ref 0.0–0.1)
BASOS PCT: 0 % (ref 0–1)
EOS ABS: 0.2 10*3/uL (ref 0.0–0.7)
EOS PCT: 2 % (ref 0–5)
HCT: 41.2 % (ref 36.0–46.0)
Hemoglobin: 14 g/dL (ref 12.0–15.0)
Lymphocytes Relative: 15 % (ref 12–46)
Lymphs Abs: 1.5 10*3/uL (ref 0.7–4.0)
MCH: 28.9 pg (ref 26.0–34.0)
MCHC: 34 g/dL (ref 30.0–36.0)
MCV: 84.9 fL (ref 78.0–100.0)
Monocytes Absolute: 0.8 10*3/uL (ref 0.1–1.0)
Monocytes Relative: 7 % (ref 3–12)
Neutro Abs: 7.7 10*3/uL (ref 1.7–7.7)
Neutrophils Relative %: 75 % (ref 43–77)
PLATELETS: 238 10*3/uL (ref 150–400)
RBC: 4.85 MIL/uL (ref 3.87–5.11)
RDW: 13.6 % (ref 11.5–15.5)
WBC: 10.2 10*3/uL (ref 4.0–10.5)

## 2013-10-29 LAB — COMPREHENSIVE METABOLIC PANEL
ALBUMIN: 3.7 g/dL (ref 3.5–5.2)
ALK PHOS: 67 U/L (ref 39–117)
ALT: 35 U/L (ref 0–35)
AST: 29 U/L (ref 0–37)
BILIRUBIN TOTAL: 0.4 mg/dL (ref 0.3–1.2)
BUN: 26 mg/dL — AB (ref 6–23)
CHLORIDE: 99 meq/L (ref 96–112)
CO2: 26 mEq/L (ref 19–32)
Calcium: 9 mg/dL (ref 8.4–10.5)
Creatinine, Ser: 0.82 mg/dL (ref 0.50–1.10)
GFR calc Af Amer: >90 mL/min (ref >90)
GFR calc non Af Amer: 79 mL/min — ABNORMAL LOW (ref >90)
Glucose, Bld: 171 mg/dL — ABNORMAL HIGH (ref 70–99)
POTASSIUM: 3.9 meq/L (ref 3.7–5.3)
Sodium: 138 mEq/L (ref 137–147)
Total Protein: 7.1 g/dL (ref 6.0–8.3)

## 2013-10-29 LAB — LIPASE, BLOOD: LIPASE: 18 U/L (ref 11–59)

## 2013-10-29 MED ORDER — SODIUM CHLORIDE 0.9 % IV BOLUS (SEPSIS)
1000.0000 mL | Freq: Once | INTRAVENOUS | Status: AC
  Administered 2013-10-29: 1000 mL via INTRAVENOUS

## 2013-10-29 MED ORDER — ONDANSETRON HCL 4 MG/2ML IJ SOLN
4.0000 mg | Freq: Once | INTRAMUSCULAR | Status: AC
  Administered 2013-10-29: 4 mg via INTRAVENOUS
  Filled 2013-10-29: qty 2

## 2013-10-29 MED ORDER — DICYCLOMINE HCL 10 MG/ML IM SOLN
20.0000 mg | Freq: Once | INTRAMUSCULAR | Status: AC
  Administered 2013-10-30: 20 mg via INTRAMUSCULAR
  Filled 2013-10-29: qty 2

## 2013-10-29 NOTE — ED Notes (Signed)
Patient report emesis and diarrhea that started Saturday. Reports still experiencing diarrhea today. Also complaining of stomach cramps and pain to epigastric area.

## 2013-10-30 LAB — URINALYSIS, ROUTINE W REFLEX MICROSCOPIC
BILIRUBIN URINE: NEGATIVE
Glucose, UA: NEGATIVE mg/dL
Hgb urine dipstick: NEGATIVE
KETONES UR: 15 mg/dL — AB
Leukocytes, UA: NEGATIVE
NITRITE: NEGATIVE
PH: 6 (ref 5.0–8.0)
Protein, ur: NEGATIVE mg/dL
Specific Gravity, Urine: 1.025 (ref 1.005–1.030)
UROBILINOGEN UA: 2 mg/dL — AB (ref 0.0–1.0)

## 2013-10-30 MED ORDER — PB-HYOSCY-ATROPINE-SCOPOLAMINE 16.2 MG PO TABS: 1.0000 | ORAL_TABLET | Freq: Three times a day (TID) | ORAL | Status: DC | PRN

## 2013-10-30 MED ORDER — ONDANSETRON HCL 4 MG/2ML IJ SOLN
4.0000 mg | Freq: Once | INTRAMUSCULAR | Status: AC
  Administered 2013-10-30: 4 mg via INTRAVENOUS
  Filled 2013-10-30: qty 2

## 2013-10-30 MED ORDER — ONDANSETRON HCL 4 MG PO TABS: 4.0000 mg | ORAL_TABLET | Freq: Four times a day (QID) | ORAL | Status: DC

## 2013-10-30 MED ORDER — PB-HYOSCY-ATROPINE-SCOPOLAMINE 16.2 MG/5ML PO ELIX
10.0000 mL | ORAL_SOLUTION | Freq: Once | ORAL | Status: AC
  Administered 2013-10-30: 32.4 mg via ORAL
  Filled 2013-10-30: qty 2

## 2013-10-30 MED ORDER — HYDROMORPHONE HCL PF 1 MG/ML IJ SOLN
0.5000 mg | Freq: Once | INTRAMUSCULAR | Status: AC
  Administered 2013-10-30: 0.5 mg via INTRAVENOUS
  Filled 2013-10-30: qty 1

## 2013-10-30 NOTE — Discharge Instructions (Signed)
Viral Gastroenteritis Viral gastroenteritis is also known as stomach flu. This condition affects the stomach and intestinal tract. It can cause sudden diarrhea and vomiting. The illness typically lasts 3 to 8 days. Most people develop an immune response that eventually gets rid of the virus. While this natural response develops, the virus can make you quite ill. CAUSES  Many different viruses can cause gastroenteritis, such as rotavirus or noroviruses. You can catch one of these viruses by consuming contaminated food or water. You may also catch a virus by sharing utensils or other personal items with an infected person or by touching a contaminated surface. SYMPTOMS  The most common symptoms are diarrhea and vomiting. These problems can cause a severe loss of body fluids (dehydration) and a body salt (electrolyte) imbalance. Other symptoms may include:  Fever.  Headache.  Fatigue.  Abdominal pain. DIAGNOSIS  Your caregiver can usually diagnose viral gastroenteritis based on your symptoms and a physical exam. A stool sample may also be taken to test for the presence of viruses or other infections. TREATMENT  This illness typically goes away on its own. Treatments are aimed at rehydration. The most serious cases of viral gastroenteritis involve vomiting so severely that you are not able to keep fluids down. In these cases, fluids must be given through an intravenous line (IV). HOME CARE INSTRUCTIONS   Drink enough fluids to keep your urine clear or pale yellow. Drink small amounts of fluids frequently and increase the amounts as tolerated.  Ask your caregiver for specific rehydration instructions.  Avoid:  Foods high in sugar.  Alcohol.  Carbonated drinks.  Tobacco.  Juice.  Caffeine drinks.  Extremely hot or cold fluids.  Fatty, greasy foods.  Too much intake of anything at one time.  Dairy products until 24 to 48 hours after diarrhea stops.  You may consume probiotics.  Probiotics are active cultures of beneficial bacteria. They may lessen the amount and number of diarrheal stools in adults. Probiotics can be found in yogurt with active cultures and in supplements.  Wash your hands well to avoid spreading the virus.  Only take over-the-counter or prescription medicines for pain, discomfort, or fever as directed by your caregiver. Do not give aspirin to children. Antidiarrheal medicines are not recommended.  Ask your caregiver if you should continue to take your regular prescribed and over-the-counter medicines.  Keep all follow-up appointments as directed by your caregiver. SEEK IMMEDIATE MEDICAL CARE IF:   You are unable to keep fluids down.  You do not urinate at least once every 6 to 8 hours.  You develop shortness of breath.  You notice blood in your stool or vomit. This may look like coffee grounds.  You have abdominal pain that increases or is concentrated in one small area (localized).  You have persistent vomiting or diarrhea.  You have a fever.  The patient is a child younger than 3 months, and he or she has a fever.  The patient is a child older than 3 months, and he or she has a fever and persistent symptoms.  The patient is a child older than 3 months, and he or she has a fever and symptoms suddenly get worse.  The patient is a baby, and he or she has no tears when crying. MAKE SURE YOU:   Understand these instructions.  Will watch your condition.  Will get help right away if you are not doing well or get worse. Document Released: 08/17/2005 Document Revised: 11/09/2011 Document Reviewed: 06/03/2011   [employer] Patient Information 2014 [employer].  

## 2013-10-30 NOTE — ED Notes (Signed)
Pt's husband reports pharmacy did not have the donnatal.  Notified J. Idol pa and prescription was changed to Bentyl 20mg  po four times a day prn cramping.   Dispense 15.   Prescription called in to [facility] in [place] as requested by pt's husband.  ALso called in prescription for zofran 4mg  po every 6 hours prn n/v, dispense 12.

## 2013-10-30 NOTE — ED Notes (Signed)
abd cramping again. I asked pt if she was comfortable going home and she said yes. Encouraged pt to follow clear liq diet for remainder of day, increase slowly afterwards. Check blood sugars frequently. Return as needed

## 2013-10-30 NOTE — ED Provider Notes (Signed)
Medical screening examination/treatment/procedure(s) were performed by non-physician practitioner and as supervising physician I was immediately available for consultation/collaboration.   Delora Fuel, MD [phone]

## 2013-10-30 NOTE — ED Notes (Signed)
C/o" abd cramping" 

## 2013-10-30 NOTE — ED Provider Notes (Signed)
Call from pharmacy - donnatal no longer manufactured,  Will switch patient to bentyl 20 mg qid prn cramping #15.  Called to pharmacy by charge RN.  Evalee Jefferson, PA-C 10/30/13 1212

## 2013-10-30 NOTE — ED Provider Notes (Signed)
Medical screening examination/treatment/procedure(s) were performed by non-physician practitioner and as supervising physician I was immediately available for consultation/collaboration.   EKG Interpretation None       Orlie Dakin, MD 10/30/13 [phone]

## 2013-10-30 NOTE — ED Provider Notes (Signed)
CSN: [id number]     Arrival date & time 10/29/13  2153 History   First MD Initiated Contact with Patient 10/29/13 2259     Chief Complaint  Patient presents with  . Emesis  . Diarrhea     (Consider location/radiation/quality/duration/timing/severity/associated sxs/prior Treatment) HPI Comments: Patient is 58 year old female with PMHx significant for NIDDM, HTN and GERD who presents to the ED with complaints of nausea, vomiting and diarrhea that started on Saturday.  She reports no recent travel or sick contacts.  States that she started with the vomiting, with multiple episodes of NBNB vomiting associated with crampy epigastric abdominal pain.  She states that by today the vomiting had stopped, she still had nausea but was able to keep down medication and sips of fluids.  She states this evening the diarrhea began and she has had about 3-4 watery diarrhea episodes without blood or mucous.  She reports continued occasional cramping of the abdominal area but no focal pain.  She denies fever, chills, abdominal pain that is constant, chest pain, shortness of breath, vaginal discharge or bleeding, dysuria, hematuria.   Patient is a 58 y.o. female presenting with vomiting and diarrhea. The history is provided by the patient and the spouse. No language interpreter was used.  Emesis Severity:  Severe Duration:  2 days Timing:  Constant Number of daily episodes:  3-4 Quality:  Unable to specify Progression:  Partially resolved Chronicity:  New Recent urination:  Normal Relieved by:  Nothing Worsened by:  Nothing tried Associated symptoms: abdominal pain and diarrhea   Associated symptoms: no arthralgias, no chills, no cough, no fever, no headaches, no myalgias, no sore throat and no URI   Diarrhea Associated symptoms: abdominal pain and vomiting   Associated symptoms: no arthralgias, no chills, no recent cough, no headaches, no myalgias and no URI     Past Medical History  Diagnosis Date  .  Diabetes mellitus   . Hypertension   . GERD (gastroesophageal reflux disease)    Past Surgical History  Procedure Laterality Date  . Back surgery    . Abdominal hysterectomy    . Cholecystectomy    . Hernia repair    . Brain surgery      Cerebro spinal fluid leak  . Left foot surgery    . Cesarean section      X 2  . Dilation and curettage of uterus      X 3  . Colonoscopy  08/28/2011    Procedure: COLONOSCOPY;  Surgeon: Corbin Ade, MD;  Location: [facility];  Service: Endoscopy;  Laterality: N/A;  2:45 PM   Family History  Problem Relation Age of Onset  . Colon cancer Neg Hx    History  Substance Use Topics  . Smoking status: Former Smoker -- 1.00 packs/day for 29 years    Types: Cigarettes  . Smokeless tobacco: Not on file  . Alcohol Use: No   OB History   Grav Para Term Preterm Abortions TAB SAB Ect Mult Living                 Review of Systems  Constitutional: Negative for chills.  HENT: Negative for sore throat.   Gastrointestinal: Positive for vomiting, abdominal pain and diarrhea.  Musculoskeletal: Negative for arthralgias and myalgias.  Neurological: Negative for headaches.  All other systems reviewed and are negative.      Allergies  Other; Codeine; Hydrocodone-acetaminophen; Sulfa antibiotics; and Tetracyclines & related  Home Medications  Current Outpatient Rx  Name  Route  Sig  Dispense  Refill  . acetaminophen (TYLENOL) 650 MG CR tablet   Oral   Take 1,300 mg by mouth every 8 (eight) hours as needed for pain.         Marland Kitchen amLODipine (NORVASC) 10 MG tablet   Oral   Take 10 mg by mouth daily.           Marland Kitchen losartan (COZAAR) 100 MG tablet   Oral   Take 100 mg by mouth daily.           . metFORMIN (GLUCOPHAGE) 500 MG tablet   Oral   Take 500 mg by mouth 2 (two) times daily with a meal.           . mometasone (NASONEX) 50 MCG/ACT nasal spray   Nasal   Place 2 sprays into the nose daily as needed (congestion).         .  montelukast (SINGULAIR) 10 MG tablet   Oral   Take 10 mg by mouth at bedtime.           . Multiple Vitamins-Minerals (MULTIVITAMINS THER. W/MINERALS) TABS   Oral   Take 1 tablet by mouth daily.           . naproxen (NAPROSYN) 500 MG tablet   Oral   Take 500 mg by mouth 2 (two) times daily as needed. Pain          . omeprazole (PRILOSEC) 20 MG capsule   Oral   Take 20 mg by mouth daily.            BP 123/78  Pulse 84  Temp(Src) 98.1 F (36.7 C) (Oral)  Resp 16  Ht 5\' 5"  (1.651 m)  Wt 215 lb (97.523 kg)  BMI 35.78 kg/m2  SpO2 100% Physical Exam  Nursing note and vitals reviewed. Constitutional: She is oriented to person, place, and time. She appears well-developed and well-nourished. No distress.  HENT:  Head: Normocephalic and atraumatic.  Right Ear: External ear normal.  Left Ear: External ear normal.  Nose: Nose normal.  Mouth/Throat: Oropharynx is clear and moist. No oropharyngeal exudate.  Eyes: Conjunctivae are normal. Pupils are equal, round, and reactive to light. No scleral icterus.  Neck: Normal range of motion. Neck supple.  Cardiovascular: Normal rate, regular rhythm and normal heart sounds.  Exam reveals no gallop and no friction rub.   No murmur heard. Pulmonary/Chest: Effort normal and breath sounds normal. No respiratory distress. She has no wheezes. She has no rales. She exhibits no tenderness.  Abdominal: Soft. Bowel sounds are normal. She exhibits no distension and no mass. There is tenderness in the epigastric area. There is no rebound and no guarding.    Musculoskeletal: Normal range of motion. She exhibits no edema and no tenderness.  Lymphadenopathy:    She has no cervical adenopathy.  Neurological: She is alert and oriented to person, place, and time. She exhibits normal muscle tone. Coordination normal.  Skin: Skin is warm and dry. No rash noted. No erythema. No pallor.  Psychiatric: She has a normal mood and affect. Her behavior is  normal. Judgment and thought content normal.    ED Course  Procedures (including critical care time) Labs Review Labs Reviewed  COMPREHENSIVE METABOLIC PANEL - Abnormal; Notable for the following:    Glucose, Bld 171 (*)    BUN 26 (*)    GFR calc non Af Amer 79 (*)    All other  components within normal limits  CBC WITH DIFFERENTIAL  LIPASE, BLOOD  URINALYSIS, ROUTINE W REFLEX MICROSCOPIC   Imaging Review No results found.   EKG Interpretation None     Results for orders placed during the hospital encounter of 10/29/13  COMPREHENSIVE METABOLIC PANEL      Result Value Ref Range   Sodium 138  137 - 147 mEq/L   Potassium 3.9  3.7 - 5.3 mEq/L   Chloride 99  96 - 112 mEq/L   CO2 26  19 - 32 mEq/L   Glucose, Bld 171 (*) 70 - 99 mg/dL   BUN 26 (*) 6 - 23 mg/dL   Creatinine, Ser 0.82  0.50 - 1.10 mg/dL   Calcium 9.0  8.4 - 10.5 mg/dL   Total Protein 7.1  6.0 - 8.3 g/dL   Albumin 3.7  3.5 - 5.2 g/dL   AST 29  0 - 37 U/L   ALT 35  0 - 35 U/L   Alkaline Phosphatase 67  39 - 117 U/L   Total Bilirubin 0.4  0.3 - 1.2 mg/dL   GFR calc non Af Amer 79 (*) >90 mL/min   GFR calc Af Amer >90  >90 mL/min  CBC WITH DIFFERENTIAL      Result Value Ref Range   WBC 10.2  4.0 - 10.5 K/uL   RBC 4.85  3.87 - 5.11 MIL/uL   Hemoglobin 14.0  12.0 - 15.0 g/dL   HCT 41.2  36.0 - 46.0 %   MCV 84.9  78.0 - 100.0 fL   MCH 28.9  26.0 - 34.0 pg   MCHC 34.0  30.0 - 36.0 g/dL   RDW 13.6  11.5 - 15.5 %   Platelets 238  150 - 400 K/uL   Neutrophils Relative % 75  43 - 77 %   Neutro Abs 7.7  1.7 - 7.7 K/uL   Lymphocytes Relative 15  12 - 46 %   Lymphs Abs 1.5  0.7 - 4.0 K/uL   Monocytes Relative 7  3 - 12 %   Monocytes Absolute 0.8  0.1 - 1.0 K/uL   Eosinophils Relative 2  0 - 5 %   Eosinophils Absolute 0.2  0.0 - 0.7 K/uL   Basophils Relative 0  0 - 1 %   Basophils Absolute 0.0  0.0 - 0.1 K/uL  LIPASE, BLOOD      Result Value Ref Range   Lipase 18  11 - 59 U/L   No results  found.  Medications  sodium chloride 0.9 % bolus 1,000 mL (1,000 mLs Intravenous New Bag/Given 10/29/13 2253)  ondansetron (ZOFRAN) injection 4 mg (4 mg Intravenous Given 10/29/13 2254)  dicyclomine (BENTYL) injection 20 mg (20 mg Intramuscular Given 10/30/13 0006)  HYDROmorphone (DILAUDID) injection 0.5 mg (0.5 mg Intravenous Given 10/30/13 0054)  ondansetron (ZOFRAN) injection 4 mg (4 mg Intravenous Given 10/30/13 0055)  belladonna-PHENObarbital (DONNATAL) 16.2 MG/5ML elixir 32.4 mg (32.4 mg Oral Given 10/30/13 0133)    MDM   Gastroenteritis  Patient here with a day history of nausea, vomting, diarrhea, cramping abdominal pain, though she has a history of DM, there is no evidence of DKA, she has labs similar to her baseline.  The pain comes and goes so I do not believe that imaging is further needed at this time.  Patient feels some better after the medication - will give nausea medicaiton and the donnatal for the cramps.   Idalia Needle Joelyn Oms, PA-C 10/30/13 [phone]

## 2014-01-12 ENCOUNTER — Ambulatory Visit (INDEPENDENT_AMBULATORY_CARE_PROVIDER_SITE_OTHER): Payer: Managed Care, Other (non HMO) | Admitting: Ophthalmology

## 2014-01-12 DIAGNOSIS — E1139 Type 2 diabetes mellitus with other diabetic ophthalmic complication: Secondary | ICD-10-CM

## 2014-01-12 DIAGNOSIS — H35039 Hypertensive retinopathy, unspecified eye: Secondary | ICD-10-CM

## 2014-01-12 DIAGNOSIS — H47219 Primary optic atrophy, unspecified eye: Secondary | ICD-10-CM

## 2014-01-12 DIAGNOSIS — E1165 Type 2 diabetes mellitus with hyperglycemia: Secondary | ICD-10-CM

## 2014-01-12 DIAGNOSIS — H43819 Vitreous degeneration, unspecified eye: Secondary | ICD-10-CM

## 2014-01-12 DIAGNOSIS — G932 Benign intracranial hypertension: Secondary | ICD-10-CM

## 2014-01-12 DIAGNOSIS — I10 Essential (primary) hypertension: Secondary | ICD-10-CM

## 2014-01-12 DIAGNOSIS — E11319 Type 2 diabetes mellitus with unspecified diabetic retinopathy without macular edema: Secondary | ICD-10-CM

## 2014-01-12 DIAGNOSIS — H2510 Age-related nuclear cataract, unspecified eye: Secondary | ICD-10-CM

## 2014-01-26 ENCOUNTER — Other Ambulatory Visit (HOSPITAL_COMMUNITY): Payer: Self-pay | Admitting: Family Medicine

## 2014-01-26 DIAGNOSIS — Z1231 Encounter for screening mammogram for malignant neoplasm of breast: Secondary | ICD-10-CM

## 2014-02-01 ENCOUNTER — Ambulatory Visit (HOSPITAL_COMMUNITY)
Admission: RE | Admit: 2014-02-01 | Discharge: 2014-02-01 | Disposition: A | Discharge status: Home / Self Care | Payer: Managed Care, Other (non HMO) | Source: Ambulatory Visit | Attending: Family Medicine | Admitting: Family Medicine

## 2014-02-01 DIAGNOSIS — Z1231 Encounter for screening mammogram for malignant neoplasm of breast: Secondary | ICD-10-CM | POA: Insufficient documentation

## 2014-03-15 ENCOUNTER — Ambulatory Visit (INDEPENDENT_AMBULATORY_CARE_PROVIDER_SITE_OTHER): Payer: Managed Care, Other (non HMO) | Admitting: Obstetrics & Gynecology

## 2014-03-15 ENCOUNTER — Encounter: Payer: Self-pay | Admitting: Obstetrics & Gynecology

## 2014-03-15 VITALS — BP 110/80 | Ht 65.0 in | Wt 209.0 lb

## 2014-03-15 DIAGNOSIS — I10 Essential (primary) hypertension: Secondary | ICD-10-CM

## 2014-03-15 DIAGNOSIS — Z01419 Encounter for gynecological examination (general) (routine) without abnormal findings: Secondary | ICD-10-CM

## 2014-03-15 DIAGNOSIS — Z1212 Encounter for screening for malignant neoplasm of rectum: Secondary | ICD-10-CM

## 2014-03-15 DIAGNOSIS — E119 Type 2 diabetes mellitus without complications: Secondary | ICD-10-CM | POA: Insufficient documentation

## 2014-03-15 MED ORDER — TERCONAZOLE 0.4 % VA CREA: 1.0000 | TOPICAL_CREAM | Freq: Every day | VAGINAL | Status: DC

## 2014-03-15 NOTE — Progress Notes (Signed)
Patient ID: Rebecca Burke, female   DOB: 1956/01/20, 58 y.o.   MRN: [id number] Subjective:     Rebecca Burke is a 58 y.o. female here for a routine exam.  No LMP recorded. Patient has had a hysterectomy. No obstetric history on file. Birth Control Method:  hyst Menstrual Calendar(currently): na  Current complaints: none.   Current acute medical issues:  none   Recent Gynecologic History No LMP recorded. Patient has had a hysterectomy. Last Pap: na,   Last mammogram: 01/2014,  normal  Past Medical History  Diagnosis Date  . Diabetes mellitus   . Hypertension   . GERD (gastroesophageal reflux disease)   . Endometriosis   . Meningitis     Past Surgical History  Procedure Laterality Date  . Back surgery    . Abdominal hysterectomy    . Cholecystectomy    . Hernia repair    . Brain surgery      Cerebro spinal fluid leak  . Left foot surgery    . Cesarean section      X 2  . Dilation and curettage of uterus      X 3  . Colonoscopy  08/28/2011    Procedure: COLONOSCOPY;  Surgeon: Daneil Dolin, MD;  Location: [facility];  Service: Endoscopy;  Laterality: N/A;  2:45 PM  . Pilonidal cyst removed    . Csf leaf      OB History   Grav Para Term Preterm Abortions TAB SAB Ect Mult Living                  History   Social History  . Marital Status: Married    Spouse Name: N/A    Number of Children: N/A  . Years of Education: N/A   Social History Main Topics  . Smoking status: Former Smoker -- 1.00 packs/day for 29 years    Types: Cigarettes  . Smokeless tobacco: None  . Alcohol Use: No  . Drug Use: No  . Sexual Activity: None   Other Topics Concern  . None   Social History Narrative  . None    Family History  Problem Relation Age of Onset  . Colon cancer Neg Hx      Review of Systems  Review of Systems  Constitutional: Negative for fever, chills, weight loss, malaise/fatigue and diaphoresis.  HENT: Negative for hearing loss, ear pain,  nosebleeds, congestion, sore throat, neck pain, tinnitus and ear discharge.   Eyes: Negative for blurred vision, double vision, photophobia, pain, discharge and redness.  Respiratory: Negative for cough, hemoptysis, sputum production, shortness of breath, wheezing and stridor.   Cardiovascular: Negative for chest pain, palpitations, orthopnea, claudication, leg swelling and PND.  Gastrointestinal: negative for abdominal pain. Negative for heartburn, nausea, vomiting, diarrhea, constipation, blood in stool and melena.  Genitourinary: Negative for dysuria, urgency, frequency, hematuria and flank pain.  Musculoskeletal: Negative for myalgias, back pain, joint pain and falls.  Skin: Negative for itching and rash.  Neurological: Negative for dizziness, tingling, tremors, sensory change, speech change, focal weakness, seizures, loss of consciousness, weakness and headaches.  Endo/Heme/Allergies: Negative for environmental allergies and polydipsia. Does not bruise/bleed easily.  Psychiatric/Behavioral: Negative for depression, suicidal ideas, hallucinations, memory loss and substance abuse. The patient is not nervous/anxious and does not have insomnia.        Objective:    Physical Exam  Vitals reviewed. Constitutional: She is oriented to person, place, and time. She appears well-developed and well-nourished.  HENT:  Head: Normocephalic and atraumatic.        Right Ear: External ear normal.  Left Ear: External ear normal.  Nose: Nose normal.  Mouth/Throat: Oropharynx is clear and moist.  Eyes: Conjunctivae and EOM are normal. Pupils are equal, round, and reactive to light. Right eye exhibits no discharge. Left eye exhibits no discharge. No scleral icterus.  Neck: Normal range of motion. Neck supple. No tracheal deviation present. No thyromegaly present.  Cardiovascular: Normal rate, regular rhythm, normal heart sounds and intact distal pulses.  Exam reveals no gallop and no friction rub.   No  murmur heard. Respiratory: Effort normal and breath sounds normal. No respiratory distress. She has no wheezes. She has no rales. She exhibits no tenderness.  GI: Soft. Bowel sounds are normal. She exhibits no distension and no mass. There is no tenderness. There is no rebound and no guarding.  Genitourinary:  Breasts no masses skin changes or nipple changes bilaterally      Vulva is normal without lesions Vagina is pink moist + yeast on abx for bronchitis Cervix absent Uterus is absent Adnexa is negative Rectal    hemoccult negative, normal tone, no masses  Musculoskeletal: Normal range of motion. She exhibits no edema and no tenderness.  Neurological: She is alert and oriented to person, place, and time. She has normal reflexes. She displays normal reflexes. No cranial nerve deficit. She exhibits normal muscle tone. Coordination normal.  Skin: Skin is warm and dry. No rash noted. No erythema. No pallor.  Psychiatric: She has a normal mood and affect. Her behavior is normal. Judgment and thought content normal.       Assessment:    normal gyn exam  Plan:    Follow up in: 1 year. terazol 7

## 2015-01-17 ENCOUNTER — Ambulatory Visit (INDEPENDENT_AMBULATORY_CARE_PROVIDER_SITE_OTHER): Payer: Managed Care, Other (non HMO) | Admitting: Ophthalmology

## 2015-02-08 ENCOUNTER — Ambulatory Visit (INDEPENDENT_AMBULATORY_CARE_PROVIDER_SITE_OTHER): Payer: Managed Care, Other (non HMO) | Admitting: Ophthalmology

## 2015-02-08 DIAGNOSIS — I10 Essential (primary) hypertension: Secondary | ICD-10-CM

## 2015-02-08 DIAGNOSIS — E11319 Type 2 diabetes mellitus with unspecified diabetic retinopathy without macular edema: Secondary | ICD-10-CM | POA: Diagnosis not present

## 2015-02-08 DIAGNOSIS — H43813 Vitreous degeneration, bilateral: Secondary | ICD-10-CM

## 2015-02-08 DIAGNOSIS — E11329 Type 2 diabetes mellitus with mild nonproliferative diabetic retinopathy without macular edema: Secondary | ICD-10-CM | POA: Diagnosis not present

## 2015-02-08 DIAGNOSIS — H35033 Hypertensive retinopathy, bilateral: Secondary | ICD-10-CM

## 2015-02-25 ENCOUNTER — Other Ambulatory Visit: Payer: Self-pay

## 2015-04-12 ENCOUNTER — Ambulatory Visit (INDEPENDENT_AMBULATORY_CARE_PROVIDER_SITE_OTHER): Payer: Managed Care, Other (non HMO) | Admitting: Internal Medicine

## 2015-04-12 ENCOUNTER — Encounter: Payer: Self-pay | Admitting: Internal Medicine

## 2015-04-12 VITALS — BP 141/72 | HR 75 | Temp 97.2°F | Ht 65.0 in | Wt 214.6 lb

## 2015-04-12 DIAGNOSIS — R109 Unspecified abdominal pain: Secondary | ICD-10-CM | POA: Diagnosis not present

## 2015-04-12 DIAGNOSIS — R197 Diarrhea, unspecified: Secondary | ICD-10-CM | POA: Insufficient documentation

## 2015-04-12 DIAGNOSIS — K219 Gastro-esophageal reflux disease without esophagitis: Secondary | ICD-10-CM | POA: Diagnosis not present

## 2015-04-12 LAB — CBC WITH DIFFERENTIAL/PLATELET
BASOS ABS: 0.1 10*3/uL (ref 0.0–0.1)
BASOS PCT: 1 % (ref 0–1)
EOS ABS: 0.3 10*3/uL (ref 0.0–0.7)
EOS PCT: 4 % (ref 0–5)
HCT: 36.7 % (ref 36.0–46.0)
Hemoglobin: 12.5 g/dL (ref 12.0–15.0)
Lymphocytes Relative: 31 % (ref 12–46)
Lymphs Abs: 2.4 10*3/uL (ref 0.7–4.0)
MCH: 28.5 pg (ref 26.0–34.0)
MCHC: 34.1 g/dL (ref 30.0–36.0)
MCV: 83.8 fL (ref 78.0–100.0)
MONO ABS: 0.7 10*3/uL (ref 0.1–1.0)
MONOS PCT: 9 % (ref 3–12)
MPV: 9.8 fL (ref 8.6–12.4)
NEUTROS ABS: 4.3 10*3/uL (ref 1.7–7.7)
Neutrophils Relative %: 55 % (ref 43–77)
PLATELETS: 268 10*3/uL (ref 150–400)
RBC: 4.38 MIL/uL (ref 3.87–5.11)
RDW: 14.7 % (ref 11.5–15.5)
WBC: 7.9 10*3/uL (ref 4.0–10.5)

## 2015-04-12 LAB — BASIC METABOLIC PANEL
BUN: 15 mg/dL (ref 7–25)
CO2: 26 mmol/L (ref 20–31)
CREATININE: 0.77 mg/dL (ref 0.50–1.05)
Calcium: 9.2 mg/dL (ref 8.6–10.4)
Chloride: 105 mmol/L (ref 98–110)
Glucose, Bld: 126 mg/dL — ABNORMAL HIGH (ref 65–99)
POTASSIUM: 4 mmol/L (ref 3.5–5.3)
SODIUM: 141 mmol/L (ref 135–146)

## 2015-04-12 LAB — HEPATIC FUNCTION PANEL
ALBUMIN: 3.9 g/dL (ref 3.6–5.1)
ALT: 19 U/L (ref 6–29)
AST: 13 U/L (ref 10–35)
Alkaline Phosphatase: 68 U/L (ref 33–130)
BILIRUBIN DIRECT: 0.1 mg/dL (ref <=0.2)
BILIRUBIN INDIRECT: 0.2 mg/dL (ref 0.2–1.2)
BILIRUBIN TOTAL: 0.3 mg/dL (ref 0.2–1.2)
TOTAL PROTEIN: 6.3 g/dL (ref 6.1–8.1)

## 2015-04-12 LAB — HEMOGLOBIN A1C
HEMOGLOBIN A1C: 7.2 % — AB (ref <5.7)
Mean Plasma Glucose: 160 mg/dL — ABNORMAL HIGH (ref <117)

## 2015-04-12 LAB — LIPASE: Lipase: 20 U/L (ref 7–60)

## 2015-04-12 NOTE — Patient Instructions (Addendum)
Schedule a diagnostic EGD in about 2-3 weeks (epigastric pain)  Return stool sample for occult blood testing  CBC, LFTs, Lipase, TTG IGA and total IGA  Depending on above findings, may need a colonoscopy as well to evaluate chronic diarrhea   May increase Bentyl to 10 mg before meals and at bedtime as needed for diarrhea.  Hold metformin night before and morning of procedure

## 2015-04-12 NOTE — Progress Notes (Signed)
Primary Care Physician:  Leonides Grills, MD Primary Gastroenterologist:  Dr. Gala Romney  Pre-Procedure History & Physical: HPI:  Rebecca Burke is a 59 y.o. female here for for evaluation of her several month history of intermittent, vague epigastric pain. Not necessarily related to meals. May come on in the middle of the night.  Not affected by eating, fasting or having a bowel movement. She has long-standing chronic GERD symptoms; she describes as heartburn chronically infrequently;  she is taking omeprazole for years. No early satiety or dysphagia, odynophagia. Her prior upper GI tract evaluation. Takes Naprosyn twice a day chronically. Denies melena. Has chronic postprandial diarrhea  -- only rarely constipated (no bowel movement for a day or 2). Colonoscopy in 2012 - single subcentimeter cecal hyperplastic polyp which was removed. No family history of IBD or colorectal cancer. Gallbladder is gone. No tobacco or alcohol. In addition to taking Naprosyn, on omeprazole daily; she also takes metformin.  Past Medical History  Diagnosis Date  . Diabetes mellitus   . Hypertension   . GERD (gastroesophageal reflux disease)   . Endometriosis   . Meningitis   . Hyperplastic colon polyp 2012    Past Surgical History  Procedure Laterality Date  . Back surgery    . Abdominal hysterectomy    . Cholecystectomy    . Hernia repair    . Brain surgery      Cerebro spinal fluid leak  . Left foot surgery    . Cesarean section      X 2  . Dilation and curettage of uterus      X 3  . Colonoscopy  08/28/2011    Dr.Lyah Millirons- suboptimal prep. I- 67m oval polyp at the splenic flexure. remainder of colonic and rectal mucosa appeared normal. bx= hyperplastic polyp.  . Pilonidal cyst removed    . Csf leaf      Prior to Admission medications   Medication Sig Start Date End Date Taking? Authorizing Provider  albuterol (PROVENTIL HFA;VENTOLIN HFA) 108 (90 BASE) MCG/ACT inhaler Inhale into the lungs every  6 (six) hours as needed for wheezing or shortness of breath.   Yes Historical Provider, MD  amLODipine (NORVASC) 10 MG tablet Take 5 mg by mouth daily.    Yes Historical Provider, MD  dicyclomine (BENTYL) 10 MG capsule Take 10 mg by mouth 2 (two) times daily.   Yes Historical Provider, MD  losartan (COZAAR) 100 MG tablet Take 100 mg by mouth daily.     Yes Historical Provider, MD  metFORMIN (GLUCOPHAGE) 500 MG tablet Take 500 mg by mouth 2 (two) times daily with a meal.     Yes Historical Provider, MD  mometasone (NASONEX) 50 MCG/ACT nasal spray Place 2 sprays into the nose daily as needed (congestion).   Yes Historical Provider, MD  montelukast (SINGULAIR) 10 MG tablet Take 10 mg by mouth at bedtime.     Yes Historical Provider, MD  Multiple Vitamins-Minerals (MULTIVITAMINS THER. W/MINERALS) TABS Take 1 tablet by mouth daily.     Yes Historical Provider, MD  naproxen (NAPROSYN) 500 MG tablet Take 500 mg by mouth 2 (two) times daily with a meal. Pain   Yes Historical Provider, MD  omeprazole (PRILOSEC) 20 MG capsule Take 20 mg by mouth daily.     Yes Historical Provider, MD  acetaminophen (TYLENOL) 650 MG CR tablet Take 1,300 mg by mouth every 8 (eight) hours as needed for pain.    Historical Provider, MD  [place] alk-PHENObarbital (DONNATAL) 16.2 MG  tablet Take 1 tablet by mouth every 8 (eight) hours as needed. Patient not taking: Reported on 04/12/2015 10/30/13   Ignacia Felling, PA-C  levofloxacin (LEVAQUIN) 500 MG tablet Take 500 mg by mouth daily.    Historical Provider, MD  ondansetron (ZOFRAN) 4 MG tablet Take 1 tablet (4 mg total) by mouth every 6 (six) hours. Patient not taking: Reported on 04/12/2015 10/30/13   Ignacia Felling, PA-C  Pseudoeph-Doxylamine-DM-APAP (NYQUIL PO) Take by mouth.    Historical Provider, MD  terconazole (TERAZOL 7) 0.4 % vaginal cream Place 1 applicator vaginally at bedtime. Patient not taking: Reported on 04/12/2015 03/15/14   Florian Buff, MD    Allergies as of  04/12/2015 - Review Complete 04/12/2015  Allergen Reaction Noted  . Other Anaphylaxis and Swelling 08/18/2011  . Codeine Other (See Comments) 08/05/2011  . Hydrocodone-acetaminophen Nausea Only 02/16/2013  . Sulfa antibiotics Other (See Comments) 08/05/2011  . Tetracyclines & related  10/29/2013    Family History  Problem Relation Age of Onset  . Colon cancer Neg Hx     Social History   Social History  . Marital Status: Married    Spouse Name: N/A  . Number of Children: N/A  . Years of Education: N/A   Occupational History  . Not on file.   Social History Main Topics  . Smoking status: Former Smoker -- 1.00 packs/day for 29 years    Types: Cigarettes  . Smokeless tobacco: Not on file     Comment: Quit x 10 years  . Alcohol Use: No  . Drug Use: No  . Sexual Activity: Not on file   Other Topics Concern  . Not on file   Social History Narrative    Review of Systems: See HPI, otherwise negative ROS  Physical Exam: BP 141/72 mmHg  Pulse 75  Temp(Src) 97.2 F (36.2 C) (Oral)  Ht 5' 5"  (1.651 m)  Wt 214 lb 9.6 oz (97.342 kg)  BMI 35.71 kg/m2 General:   Alert,  Well-developed, well-nourished, pleasant and cooperative in NAD Skin:  Intact without significant lesions or rashes. Eyes:  Sclera clear, no icterus.   Conjunctiva pink. Ears:  Normal auditory acuity. Nose:  No deformity, discharge,  or lesions. Mouth:  No deformity or lesions. Neck:  Supple; no masses or thyromegaly. No significant cervical adenopathy. Lungs:  Clear throughout to auscultation.   No wheezes, crackles, or rhonchi. No acute distress. Heart:  Regular rate and rhythm; no murmurs, clicks, rubs,  or gallops. Abdomen: obese.normal bowel sounds.  Soft with mild epigastric tenderness to palpation without appreciable mass or hepatosplenomegaly. No succussion splash Pulses:  Normal pulses noted. Extremities:  Without clubbing or edema.  Impression:  Pleasant 60 year old lady with insidious onset of  intermittent boring epigastric pain over the past couple of months. There is a nocturnal component. Not affected by meals or having a bowel movement. This is differentiated from her typical reflux symptoms which she's had for years-the latter with no prior endoscopic evaluation. Chronic diarrhea. Gallbladder is out. Bentyl  twice a day has helped with the epigastric pain to a modest degree. Not much impact on diarrhea.  Daily Naprosyn puts her at risk for peptic ulcer disease. She may have significant GERD not appreciated clinically. Doubt occult pancreatic or biliary path pathology. Chronic diarrhea with rare episodes of constipation likely related to IBS-D. However, celiac disease, medication-induced diarrhea,  Microscopic colitis and diabetic enteropathy remain in the differential.  Recommendations:  Schedule a diagnostic EGD in about 2-3 weeks (  epigastric pain and chronic GERD).  The risks, benefits, limitations, alternatives and imponderables have been reviewed with the patient. Potential for esophageal dilation, biopsy, etc. have also been reviewed.  Questions have been answered. All parties agreeable.  Return stool sample for occult blood testing  CBC, LFTs, Lipase, TTG IGA and total IGA  Depending on above findings, may need a colonoscopy as well to evaluate chronic diarrhea   May increase Bentyl to 10 mg before meals and at bedtime as needed for diarrhea.  Hold metformin night before and morning of procedure       Notice: This dictation was prepared with Dragon dictation along with smaller phrase technology. Any transcriptional errors that result from this process are unintentional and may not be corrected upon review.

## 2015-04-13 LAB — IGA: IGA: 258 mg/dL (ref 69–380)

## 2015-04-15 LAB — TISSUE TRANSGLUTAMINASE, IGA: TISSUE TRANSGLUTAMINASE AB, IGA: 1 U/mL (ref <4)

## 2015-04-18 ENCOUNTER — Ambulatory Visit (INDEPENDENT_AMBULATORY_CARE_PROVIDER_SITE_OTHER): Payer: Managed Care, Other (non HMO)

## 2015-04-18 DIAGNOSIS — R109 Unspecified abdominal pain: Secondary | ICD-10-CM | POA: Diagnosis not present

## 2015-04-18 LAB — IFOBT (OCCULT BLOOD): IMMUNOLOGICAL FECAL OCCULT BLOOD TEST: NEGATIVE

## 2015-04-18 NOTE — Progress Notes (Signed)
Pt returned IFOBT and it was negative

## 2015-04-23 ENCOUNTER — Other Ambulatory Visit: Payer: Self-pay

## 2015-04-23 DIAGNOSIS — R1013 Epigastric pain: Secondary | ICD-10-CM

## 2015-04-23 DIAGNOSIS — R197 Diarrhea, unspecified: Secondary | ICD-10-CM

## 2015-04-23 MED ORDER — PEG 3350-KCL-NA BICARB-NACL 420 G PO SOLR: 4000.0000 mL | Freq: Once | ORAL | Status: DC

## 2015-04-26 ENCOUNTER — Other Ambulatory Visit (HOSPITAL_COMMUNITY): Payer: Self-pay | Admitting: Family Medicine

## 2015-04-26 DIAGNOSIS — Z1231 Encounter for screening mammogram for malignant neoplasm of breast: Secondary | ICD-10-CM

## 2015-04-30 ENCOUNTER — Other Ambulatory Visit: Payer: Self-pay

## 2015-05-08 ENCOUNTER — Telehealth: Payer: Self-pay

## 2015-05-08 NOTE — Telephone Encounter (Signed)
Pt is calling to see if she will be okay to take a z-pack and still have her EGD on 05/16/15. Please advise

## 2015-05-08 NOTE — Telephone Encounter (Signed)
Yes

## 2015-05-08 NOTE — Telephone Encounter (Signed)
Pt is aware.  

## 2015-05-10 ENCOUNTER — Ambulatory Visit (HOSPITAL_COMMUNITY): Payer: Self-pay

## 2015-05-10 ENCOUNTER — Ambulatory Visit (HOSPITAL_COMMUNITY)
Admission: RE | Admit: 2015-05-10 | Discharge: 2015-05-10 | Disposition: A | Discharge status: Home / Self Care | Payer: Managed Care, Other (non HMO) | Source: Ambulatory Visit | Attending: Family Medicine | Admitting: Family Medicine

## 2015-05-10 DIAGNOSIS — Z1231 Encounter for screening mammogram for malignant neoplasm of breast: Secondary | ICD-10-CM | POA: Diagnosis not present

## 2015-05-10 NOTE — Telephone Encounter (Signed)
Noted  

## 2015-05-16 ENCOUNTER — Encounter (HOSPITAL_COMMUNITY)
Admission: RE | Disposition: A | Discharge status: Home / Self Care | Payer: Self-pay | Source: Ambulatory Visit | Attending: Internal Medicine

## 2015-05-16 ENCOUNTER — Ambulatory Visit (HOSPITAL_COMMUNITY)
Admission: RE | Admit: 2015-05-16 | Discharge: 2015-05-16 | Disposition: A | Discharge status: Home / Self Care | Payer: Managed Care, Other (non HMO) | Source: Ambulatory Visit | Attending: Internal Medicine | Admitting: Internal Medicine

## 2015-05-16 ENCOUNTER — Encounter (HOSPITAL_COMMUNITY): Payer: Self-pay | Admitting: *Deleted

## 2015-05-16 DIAGNOSIS — Z86010 Personal history of colonic polyps: Secondary | ICD-10-CM | POA: Insufficient documentation

## 2015-05-16 DIAGNOSIS — I10 Essential (primary) hypertension: Secondary | ICD-10-CM | POA: Diagnosis not present

## 2015-05-16 DIAGNOSIS — R1013 Epigastric pain: Secondary | ICD-10-CM | POA: Insufficient documentation

## 2015-05-16 DIAGNOSIS — Z87891 Personal history of nicotine dependence: Secondary | ICD-10-CM | POA: Insufficient documentation

## 2015-05-16 DIAGNOSIS — Z791 Long term (current) use of non-steroidal anti-inflammatories (NSAID): Secondary | ICD-10-CM | POA: Insufficient documentation

## 2015-05-16 DIAGNOSIS — K529 Noninfective gastroenteritis and colitis, unspecified: Secondary | ICD-10-CM | POA: Insufficient documentation

## 2015-05-16 DIAGNOSIS — K259 Gastric ulcer, unspecified as acute or chronic, without hemorrhage or perforation: Secondary | ICD-10-CM | POA: Insufficient documentation

## 2015-05-16 DIAGNOSIS — K449 Diaphragmatic hernia without obstruction or gangrene: Secondary | ICD-10-CM | POA: Insufficient documentation

## 2015-05-16 DIAGNOSIS — K5730 Diverticulosis of large intestine without perforation or abscess without bleeding: Secondary | ICD-10-CM | POA: Insufficient documentation

## 2015-05-16 DIAGNOSIS — K219 Gastro-esophageal reflux disease without esophagitis: Secondary | ICD-10-CM | POA: Insufficient documentation

## 2015-05-16 DIAGNOSIS — R197 Diarrhea, unspecified: Secondary | ICD-10-CM | POA: Insufficient documentation

## 2015-05-16 DIAGNOSIS — K279 Peptic ulcer, site unspecified, unspecified as acute or chronic, without hemorrhage or perforation: Secondary | ICD-10-CM | POA: Diagnosis not present

## 2015-05-16 DIAGNOSIS — E119 Type 2 diabetes mellitus without complications: Secondary | ICD-10-CM | POA: Diagnosis not present

## 2015-05-16 HISTORY — PX: COLONOSCOPY: SHX5424

## 2015-05-16 HISTORY — PX: ESOPHAGOGASTRODUODENOSCOPY: SHX5428

## 2015-05-16 LAB — GLUCOSE, CAPILLARY: Glucose-Capillary: 169 mg/dL — ABNORMAL HIGH (ref 65–99)

## 2015-05-16 SURGERY — COLONOSCOPY
Anesthesia: Moderate Sedation

## 2015-05-16 MED ORDER — LIDOCAINE VISCOUS 2 % MT SOLN
OROMUCOSAL | Status: DC | PRN
  Administered 2015-05-16: 3 mL via OROMUCOSAL

## 2015-05-16 MED ORDER — MIDAZOLAM HCL 5 MG/5ML IJ SOLN
INTRAMUSCULAR | Status: DC | PRN
  Administered 2015-05-16 (×3): 1 mg via INTRAVENOUS
  Administered 2015-05-16: 2 mg via INTRAVENOUS
  Administered 2015-05-16: 1 mg via INTRAVENOUS

## 2015-05-16 MED ORDER — SODIUM CHLORIDE 0.9 % IV SOLN
INTRAVENOUS | Status: DC
  Administered 2015-05-16: 12:00:00 via INTRAVENOUS

## 2015-05-16 MED ORDER — ONDANSETRON HCL 4 MG/2ML IJ SOLN
INTRAMUSCULAR | Status: DC | PRN
  Administered 2015-05-16: 4 mg via INTRAVENOUS

## 2015-05-16 MED ORDER — LIDOCAINE VISCOUS 2 % MT SOLN
OROMUCOSAL | Status: AC
  Filled 2015-05-16: qty 15

## 2015-05-16 MED ORDER — MEPERIDINE HCL 100 MG/ML IJ SOLN
INTRAMUSCULAR | Status: DC | PRN
  Administered 2015-05-16: 25 mg via INTRAVENOUS
  Administered 2015-05-16: 50 mg via INTRAVENOUS
  Administered 2015-05-16: 25 mg via INTRAVENOUS

## 2015-05-16 MED ORDER — ONDANSETRON HCL 4 MG/2ML IJ SOLN
INTRAMUSCULAR | Status: AC
  Filled 2015-05-16: qty 2

## 2015-05-16 MED ORDER — MEPERIDINE HCL 100 MG/ML IJ SOLN
INTRAMUSCULAR | Status: AC
  Filled 2015-05-16: qty 2

## 2015-05-16 MED ORDER — MIDAZOLAM HCL 5 MG/5ML IJ SOLN
INTRAMUSCULAR | Status: AC
  Filled 2015-05-16: qty 10

## 2015-05-16 MED ORDER — SIMETHICONE 40 MG/0.6ML PO SUSP
ORAL | Status: DC | PRN
  Administered 2015-05-16: 13:00:00

## 2015-05-16 NOTE — Op Note (Signed)
[facility] [address] [place], [address]   ENDOSCOPY PROCEDURE REPORT  PATIENT: Rebecca Burke, Rebecca Burke  MR#: [id number] BIRTHDATE: 11-28-1955 , 5  yrs. old GENDER: female ENDOSCOPIST: R.  Garfield Cornea, MD FACP FACG REFERRED BY:  Micheline Rough, M.D. PROCEDURE DATE:  06-01-15 PROCEDURE:  EGD w/ biopsy INDICATIONS:  dyspepsia. MEDICATIONS: Versed 4 mg IV and Demerol 75 mg IV in divided doses. Zofran 4 mg IV.  Xylocaine gel orally. ASA CLASS:      Class II  CONSENT: The risks, benefits, limitations, alternatives and imponderables have been discussed.  The potential for biopsy, esophogeal dilation, etc. have also been reviewed.  Questions have been answered.  All parties agreeable.  Please see the history and physical in the medical record for more information.  DESCRIPTION OF PROCEDURE: After the risks benefits and alternatives of the procedure were thoroughly explained, informed consent was obtained.  The [id number] ([id number]) endoscope was introduced through the mouth and advanced to the second portion of the duodenum , limited by Without limitations. The instrument was slowly withdrawn as the mucosa was fully examined. Estimated blood loss is zero unless otherwise noted in this procedure report.    Normal-appearing tubular esophagus.  Stomach empty.  Linear antral erosions and patchy erythema present.  Small hiatal hernia.  In the pyloric channel, there was 8 mm elliptical ulcer.  It appeared to be benign.  Pylorus remain patent.  Examination of bulb and second portion revealed no abnormalities.  The gastric ulcer was biopsied.  Retroflexed views revealed as previously described.     The scope was then withdrawn from the patient and the procedure completed.  COMPLICATIONS: There were no immediate complications. EBL 5 mL ENDOSCOPIC IMPRESSION: elliptical pyloric channel ulcer. Gastric erosions. Status post biopsy. Hiatal  hernia.  RECOMMENDATIONS: Refrain from taking non-steroidal's. Increase omeprazole 20 mg twice daily. Follow up on pathology.  REPEAT EXAM:  eSigned:  R. Garfield Cornea, MD Rosalita Chessman [facility] 01-Jun-2015 1:14 PM    CC:  CPT CODES: ICD CODES:  The ICD and CPT codes recommended by this software are interpretations from the data that the clinical staff has captured with the software.  The verification of the translation of this report to the ICD and CPT codes and modifiers is the sole responsibility of the health care institution and practicing physician where this report was generated.  [employer] will not be held responsible for the validity of the ICD and CPT codes included on this report.  AMA assumes no liability for data contained or not contained herein. CPT is a Designer, television/film set of the [employer].

## 2015-05-16 NOTE — Discharge Instructions (Signed)
Colonoscopy Discharge Instructions  Read the instructions outlined below and refer to this sheet in the next few weeks. These discharge instructions provide you with general information on caring for yourself after you leave the hospital. Your doctor may also give you specific instructions. While your treatment has been planned according to the most current medical practices available, unavoidable complications occasionally occur. If you have any problems or questions after discharge, call Dr. Gala Romney at [phone]. ACTIVITY  You may resume your regular activity, but move at a slower pace for the next 24 hours.   Take frequent rest periods for the next 24 hours.   Walking will help get rid of the air and reduce the bloated feeling in your belly (abdomen).   No driving for 24 hours (because of the medicine (anesthesia) used during the test).    Do not sign any important legal documents or operate any machinery for 24 hours (because of the anesthesia used during the test).  NUTRITION  Drink plenty of fluids.   You may resume your normal diet as instructed by your doctor.   Begin with a light meal and progress to your normal diet. Heavy or fried foods are harder to digest and may make you feel sick to your stomach (nauseated).   Avoid alcoholic beverages for 24 hours or as instructed.  MEDICATIONS  You may resume your normal medications unless your doctor tells you otherwise.  WHAT YOU CAN EXPECT TODAY  Some feelings of bloating in the abdomen.   Passage of more gas than usual.   Spotting of blood in your stool or on the toilet paper.  IF YOU HAD POLYPS REMOVED DURING THE COLONOSCOPY:  No aspirin products for 7 days or as instructed.   No alcohol for 7 days or as instructed.   Eat a soft diet for the next 24 hours.  FINDING OUT THE RESULTS OF YOUR TEST Not all test results are available during your visit. If your test results are not back during the visit, make an appointment  with your caregiver to find out the results. Do not assume everything is normal if you have not heard from your caregiver or the medical facility. It is important for you to follow up on all of your test results.  SEEK IMMEDIATE MEDICAL ATTENTION IF:  You have more than a spotting of blood in your stool.   Your belly is swollen (abdominal distention).   You are nauseated or vomiting.   You have a temperature over 101.  You have abdominal pain or discomfort that is severe or gets worse throughout the day. EGD Discharge instructions Please read the instructions outlined below and refer to this sheet in the next few weeks. These discharge instructions provide you with general information on caring for yourself after you leave the hospital. Your doctor may also give you specific instructions. While your treatment has been planned according to the most current medical practices available, unavoidable complications occasionally occur. If you have any problems or questions after discharge, please call your doctor. ACTIVITY You may resume your regular activity but move at a slower pace for the next 24 hours.  Take frequent rest periods for the next 24 hours.  Walking will help expel (get rid of) the air and reduce the bloated feeling in your abdomen.  No driving for 24 hours (because of the anesthesia (medicine) used during the test).  You may shower.  Do not sign any important legal documents or operate any machinery for 24  hours (because of the anesthesia used during the test).  NUTRITION Drink plenty of fluids.  You may resume your normal diet.  Begin with a light meal and progress to your normal diet.  Avoid alcoholic beverages for 24 hours or as instructed by your caregiver.  MEDICATIONS You may resume your normal medications unless your caregiver tells you otherwise.  WHAT YOU CAN EXPECT TODAY You may experience abdominal discomfort such as a feeling of fullness or gas pains.   FOLLOW-UP Your doctor will discuss the results of your test with you.  SEEK IMMEDIATE MEDICAL ATTENTION IF ANY OF THE FOLLOWING OCCUR: Excessive nausea (feeling sick to your stomach) and/or vomiting.  Severe abdominal pain and distention (swelling).  Trouble swallowing.  Temperature over 101 F (37.8 C).  Rectal bleeding or vomiting of blood.     Information on peptic ulcer disease  Avoid all non-steroidal agents like Advil and Aleve  Increase Prilosec to 20 mg twice daily  Further recommendations to follow pending review of pathology report   Esophagogastroduodenoscopy Care After Refer to this sheet in the next few weeks. These instructions provide you with information on caring for yourself after your procedure. Your caregiver may also give you more specific instructions. Your treatment has been planned according to current medical practices, but problems sometimes occur. Call your caregiver if you have any problems or questions after your procedure.  HOME CARE INSTRUCTIONS Do not eat or drink anything until the numbing medicine (local anesthetic) has worn off and your gag reflex has returned. You will know that the local anesthetic has worn off when you can swallow comfortably. Do not drive for 12 hours after the procedure or as directed by your caregiver. Only take medicines as directed by your caregiver. SEEK MEDICAL CARE IF:  You cannot stop coughing. You are not urinating at all or less than usual. SEEK IMMEDIATE MEDICAL CARE IF: You have difficulty swallowing. You cannot eat or drink. You have worsening throat or chest pain. You have dizziness, lightheadedness, or you faint. You have nausea or vomiting. You have chills. You have a fever. You have severe abdominal pain. You have black, tarry, or bloody stools. Document Released: 08/03/2012 Document Reviewed: 08/03/2012 [facility] Patient Information 2015 [place]. This information is not intended to replace  advice given to you by your health care provider. Make sure you discuss any questions you have with your health care provider. Colonoscopy, Care After Refer to this sheet in the next few weeks. These instructions provide you with information on caring for yourself after your procedure. Your health care provider may also give you more specific instructions. Your treatment has been planned according to current medical practices, but problems sometimes occur. Call your health care provider if you have any problems or questions after your procedure. WHAT TO EXPECT AFTER THE PROCEDURE  After your procedure, it is typical to have the following: A small amount of blood in your stool. Moderate amounts of gas and mild abdominal cramping or bloating. HOME CARE INSTRUCTIONS Do not drive, operate machinery, or sign important documents for 24 hours. You may shower and resume your regular physical activities, but move at a slower pace for the first 24 hours. Take frequent rest periods for the first 24 hours. Walk around or put a warm pack on your abdomen to help reduce abdominal cramping and bloating. Drink enough fluids to keep your urine clear or pale yellow. You may resume your normal diet as instructed by your health care provider. Avoid  heavy or fried foods that are hard to digest. Avoid drinking alcohol for 24 hours or as instructed by your health care provider. Only take over-the-counter or prescription medicines as directed by your health care provider. If a tissue sample (biopsy) was taken during your procedure: Do not take aspirin or blood thinners for 7 days, or as instructed by your health care provider. Do not drink alcohol for 7 days, or as instructed by your health care provider. Eat soft foods for the first 24 hours. SEEK MEDICAL CARE IF: You have persistent spotting of blood in your stool 2-3 days after the procedure. SEEK IMMEDIATE MEDICAL CARE IF: You have more than a small spotting of  blood in your stool. You pass large blood clots in your stool. Your abdomen is swollen (distended). You have nausea or vomiting. You have a fever. You have increasing abdominal pain that is not relieved with medicine. Document Released: 03/31/2004 Document Revised: 06/07/2013 Document Reviewed: 04/24/2013 [facility] Patient Information 2015 [place], [place]. This information is not intended to replace advice given to you by your health care provider. Make sure you discuss any questions you have with your health care provider.

## 2015-05-16 NOTE — Op Note (Signed)
[facility] [address] [place], [address]   COLONOSCOPY PROCEDURE REPORT  PATIENT: Rebecca Burke, Rebecca Burke  MR#: [id number] BIRTHDATE: [birth year] , 40  yrs. old GENDER: female ENDOSCOPIST: R.  Garfield Cornea, MD FACP [facility] REFERRED IO:MBTDHR Ethlyn Gallery, M.D. PROCEDURE DATE:  06-12-15 PROCEDURE:   Ileo-colonoscopy with biopsy INDICATIONS:Chronic diarrhea. MEDICATIONS: Versed 6 mg IV and Demerol 100 mg IV in divided doses. Zofran 4 mg IV. ASA CLASS:       Class II  CONSENT: The risks, benefits, alternatives and imponderables including but not limited to bleeding, perforation as well as the possibility of a missed lesion have been reviewed.  The potential for biopsy, lesion removal, etc. have also been discussed. Questions have been answered.  All parties agreeable.  Please see the history and physical in the medical record for more information.  DESCRIPTION OF PROCEDURE:   After the risks benefits and alternatives of the procedure were thoroughly explained, informed consent was obtained.  The digital rectal exam revealed no abnormalities of the rectum.   The [id number] ([id number])  endoscope was introduced through the anus and advanced to the terminal ileum which was intubated for a short distance. No adverse events experienced.   The quality of the prep was adequate  The instrument was then slowly withdrawn as the colon was fully examined. Estimated blood loss is zero unless otherwise noted in this procedure report.      COLON FINDINGS: Normal-appearing rectal mucosa.  Scattered left-sided diverticula; the remainder of the colonic mucosa appeared normal.  The distal 10 cm of terminal ileal mucosa also appeared normal.  Segmental biopsies of ascending and descending/sigmoid segments taken to evaluate for microscopic colitis.  Retroflexion was performed. .  Withdrawal time=9 minutes 0 seconds.  The scope was withdrawn and the procedure  completed. COMPLICATIONS: There were no immediate complications. EBL 5 mL ENDOSCOPIC IMPRESSION: Colonic diverticulosis. Status post segmental biopsy  RECOMMENDATIONS: Follow up on pathology.  See EGD report.  eSigned:  R. Garfield Cornea, MD Rosalita Chessman [place] 06-12-15 1:32 PM   cc:  CPT CODES: ICD CODES:  The ICD and CPT codes recommended by this software are interpretations from the data that the clinical staff has captured with the software.  The verification of the translation of this report to the ICD and CPT codes and modifiers is the sole responsibility of the health care institution and practicing physician where this report was generated.  [employer] will not be held responsible for the validity of the ICD and CPT codes included on this report.  AMA assumes no liability for data contained or not contained herein. CPT is a Designer, television/film set of the [employer].  PATIENT NAME:  Rebecca Burke, Rebecca Burke MR#: [id number]

## 2015-05-16 NOTE — H&P (Signed)
Daneil Dolin, MD at 04/12/2015 10:10 AM     Status: Signed       Expand All Collapse All      Primary Care Physician: Leonides Grills, MD Primary Gastroenterologist: Dr. Gala Romney  Pre-Procedure History & Physical:  Seen in the office on August 12. HPI: Rebecca Burke is a 59 y.o. female here for for evaluation of her several month history of intermittent, vague epigastric pain. Not necessarily related to meals. May come on in the middle of the night. Not affected by eating, fasting or having a bowel movement. She has long-standing chronic GERD symptoms; she describes as heartburn chronically infrequently; she is taking omeprazole for years. No early satiety or dysphagia, odynophagia. Her prior upper GI tract evaluation. Takes Naprosyn twice a day chronically. Denies melena. Has chronic postprandial diarrhea -- only rarely constipated (no bowel movement for a day or 2). Colonoscopy in 2012 - single subcentimeter cecal hyperplastic polyp which was removed. No family history of IBD or colorectal cancer. Gallbladder is gone. No tobacco or alcohol. In addition to taking Naprosyn, on omeprazole daily; she also takes metformin.  Celiac screen negative. Stool negative for occult blood. Increased dose of dicyclomine has helped the diarrhea it has not resolved as of yet however.  Past Medical History  Diagnosis Date  . Diabetes mellitus   . Hypertension   . GERD (gastroesophageal reflux disease)   . Endometriosis   . Meningitis   . Hyperplastic colon polyp 2012    Past Surgical History  Procedure Laterality Date  . Back surgery    . Abdominal hysterectomy    . Cholecystectomy    . Hernia repair    . Brain surgery      Cerebro spinal fluid leak  . Left foot surgery    . Cesarean section      X 2  . Dilation and curettage of uterus      X 3  . Colonoscopy  08/28/2011    Dr.Geisha Abernathy-  suboptimal prep. I- 59m oval polyp at the splenic flexure. remainder of colonic and rectal mucosa appeared normal. bx= hyperplastic polyp.  . Pilonidal cyst removed    . Csf leaf      Prior to Admission medications   Medication Sig Start Date End Date Taking? Authorizing Provider  albuterol (PROVENTIL HFA;VENTOLIN HFA) 108 (90 BASE) MCG/ACT inhaler Inhale into the lungs every 6 (six) hours as needed for wheezing or shortness of breath.   Yes Historical Provider, MD  amLODipine (NORVASC) 10 MG tablet Take 5 mg by mouth daily.    Yes Historical Provider, MD  dicyclomine (BENTYL) 10 MG capsule Take 10 mg by mouth 2 (two) times daily.   Yes Historical Provider, MD  losartan (COZAAR) 100 MG tablet Take 100 mg by mouth daily.    Yes Historical Provider, MD  metFORMIN (GLUCOPHAGE) 500 MG tablet Take 500 mg by mouth 2 (two) times daily with a meal.    Yes Historical Provider, MD  mometasone (NASONEX) 50 MCG/ACT nasal spray Place 2 sprays into the nose daily as needed (congestion).   Yes Historical Provider, MD  montelukast (SINGULAIR) 10 MG tablet Take 10 mg by mouth at bedtime.    Yes Historical Provider, MD  Multiple Vitamins-Minerals (MULTIVITAMINS THER. W/MINERALS) TABS Take 1 tablet by mouth daily.    Yes Historical Provider, MD  naproxen (NAPROSYN) 500 MG tablet Take 500 mg by mouth 2 (two) times daily with a meal. Pain  Yes Historical Provider, MD  omeprazole (PRILOSEC) 20 MG capsule Take 20 mg by mouth daily.    Yes Historical Provider, MD  acetaminophen (TYLENOL) 650 MG CR tablet Take 1,300 mg by mouth every 8 (eight) hours as needed for pain.    Historical Provider, MD  [place] alk-PHENObarbital (DONNATAL) 16.2 MG tablet Take 1 tablet by mouth every 8 (eight) hours as needed. Patient not taking: Reported on 04/12/2015 10/30/13   Ignacia Felling, PA-C  levofloxacin (LEVAQUIN) 500 MG tablet Take 500 mg  by mouth daily.    Historical Provider, MD  ondansetron (ZOFRAN) 4 MG tablet Take 1 tablet (4 mg total) by mouth every 6 (six) hours. Patient not taking: Reported on 04/12/2015 10/30/13   Ignacia Felling, PA-C  Pseudoeph-Doxylamine-DM-APAP (NYQUIL PO) Take by mouth.    Historical Provider, MD  terconazole (TERAZOL 7) 0.4 % vaginal cream Place 1 applicator vaginally at bedtime. Patient not taking: Reported on 04/12/2015 03/15/14   Florian Buff, MD    Allergies as of 04/12/2015 - Review Complete 04/12/2015  Allergen Reaction Noted  . Other Anaphylaxis and Swelling 08/18/2011  . Codeine Other (See Comments) 08/05/2011  . Hydrocodone-acetaminophen Nausea Only 02/16/2013  . Sulfa antibiotics Other (See Comments) 08/05/2011  . Tetracyclines & related  10/29/2013    Family History  Problem Relation Age of Onset  . Colon cancer Neg Hx     Social History   Social History  . Marital Status: Married    Spouse Name: N/A  . Number of Children: N/A  . Years of Education: N/A   Occupational History  . Not on file.   Social History Main Topics  . Smoking status: Former Smoker -- 1.00 packs/day for 29 years    Types: Cigarettes  . Smokeless tobacco: Not on file     Comment: Quit x 10 years  . Alcohol Use: No  . Drug Use: No  . Sexual Activity: Not on file   Other Topics Concern  . Not on file   Social History Narrative    Review of Systems: See HPI, otherwise negative ROS  Physical Exam: General: Alert, Well-developed, well-nourished, pleasant and cooperative in NAD Skin: Intact without significant lesions or rashes. Eyes: Sclera clear, no icterus. Conjunctiva pink. Ears: Normal auditory acuity. Nose: No deformity, discharge, or lesions. Mouth: No deformity or lesions. Neck: Supple; no masses or thyromegaly. No significant cervical adenopathy. Lungs:  Clear throughout to auscultation. No wheezes, crackles, or rhonchi. No acute distress. Heart: Regular rate and rhythm; no murmurs, clicks, rubs, or gallops. Abdomen: obese.normal bowel sounds. Soft with mild epigastric tenderness to palpation without appreciable mass or hepatosplenomegaly. No succussion splash Pulses: Normal pulses noted. Extremities: Without clubbing or edema.  Impression: Pleasant 59 year old lady with insidious onset of intermittent boring epigastric pain over the past couple of months. There is a nocturnal component. Not affected by meals or having a bowel movement. This is differentiated from her typical reflux symptoms which she's had for years-the latter with no prior endoscopic evaluation. Chronic diarrhea. Gallbladder is out. Bentyl twice a day has helped with the epigastric pain to a modest degree. Not much impact on diarrhea.  Increasing dose of Bentyl helped more with the diarrhea. Celiac panel negative. Daily Naprosyn puts her at risk for peptic ulcer disease. She may have significant GERD not appreciated clinically. Doubt occult pancreatic or biliary path pathology. Chronic diarrhea with rare episodes of constipation likely related to IBS-D. However, celiac disease, medication-induced diarrhea, Microscopic colitis and diabetic enteropathy remain in  the differential.  Recommendations:  EGD and colonoscopy today. The risks, benefits, limitations, alternatives and imponderables have been reviewed with the patient. Potential for esophageal dilation, biopsy, etc. have also been reviewed. Questions have been answered. All parties

## 2015-05-22 ENCOUNTER — Encounter (HOSPITAL_COMMUNITY): Payer: Self-pay | Admitting: Internal Medicine

## 2015-05-22 ENCOUNTER — Encounter: Payer: Self-pay | Admitting: Internal Medicine

## 2015-05-23 ENCOUNTER — Telehealth: Payer: Self-pay

## 2015-05-23 ENCOUNTER — Encounter: Payer: Self-pay | Admitting: Internal Medicine

## 2015-05-23 NOTE — Telephone Encounter (Signed)
Per RMR-  Send letter to patient.  Send copy of letter with path to referring provider and PCP. Need office visit with extender in 3 months

## 2015-05-23 NOTE — Telephone Encounter (Signed)
Letter mailed to the pt. 

## 2015-05-31 ENCOUNTER — Ambulatory Visit: Payer: Self-pay | Admitting: Internal Medicine

## 2015-06-05 ENCOUNTER — Encounter: Payer: Self-pay | Admitting: Internal Medicine

## 2015-07-02 ENCOUNTER — Ambulatory Visit: Payer: Self-pay | Admitting: Internal Medicine

## 2015-07-11 ENCOUNTER — Ambulatory Visit: Payer: Self-pay | Admitting: Orthopedic Surgery

## 2015-07-12 ENCOUNTER — Ambulatory Visit: Payer: Self-pay | Admitting: Internal Medicine

## 2015-07-19 ENCOUNTER — Other Ambulatory Visit: Payer: Self-pay

## 2015-07-19 ENCOUNTER — Ambulatory Visit (INDEPENDENT_AMBULATORY_CARE_PROVIDER_SITE_OTHER): Payer: Managed Care, Other (non HMO) | Admitting: Internal Medicine

## 2015-07-19 ENCOUNTER — Encounter: Payer: Self-pay | Admitting: Internal Medicine

## 2015-07-19 VITALS — BP 139/85 | HR 78 | Temp 98.3°F | Ht 65.0 in | Wt 203.6 lb

## 2015-07-19 DIAGNOSIS — K219 Gastro-esophageal reflux disease without esophagitis: Secondary | ICD-10-CM

## 2015-07-19 DIAGNOSIS — K279 Peptic ulcer, site unspecified, unspecified as acute or chronic, without hemorrhage or perforation: Secondary | ICD-10-CM | POA: Diagnosis not present

## 2015-07-19 DIAGNOSIS — R197 Diarrhea, unspecified: Secondary | ICD-10-CM

## 2015-07-19 DIAGNOSIS — K259 Gastric ulcer, unspecified as acute or chronic, without hemorrhage or perforation: Secondary | ICD-10-CM

## 2015-07-19 NOTE — Progress Notes (Signed)
Primary Care Physician:  Rocky Morel, MD Primary Gastroenterologist:  Dr. Gala Romney  Pre-Procedure History & Physical: HPI:  Rebecca Burke is a 59 y.o. female here for  Follow-up of diarrhea and peptic ulcer disease. Patient is pleased;  states diarrhea  resolved with cessation of metformin. She's very happy about this improvement. She's not had any abdominal pain. Taking Naprosyn. Gastric ulcer diagnosed recently- biopsies negative for malignancy or H. Pylori. Reflux symptoms now well controlled on omeprazole 20 mg twice daily. She will be due for repeat EGD to verify ulcer healing in 2-3 weeks. Extensive evaluation for chronic diarrhea including stool studies, celiac screen and colon biopsies all negative.    Past Medical History  Diagnosis Date  . Diabetes mellitus   . Hypertension   . GERD (gastroesophageal reflux disease)   . Endometriosis   . Meningitis   . Hyperplastic colon polyp 2012  . Diverticulosis   . Gastric ulcer     Past Surgical History  Procedure Laterality Date  . Back surgery    . Abdominal hysterectomy    . Cholecystectomy    . Hernia repair    . Brain surgery      Cerebro spinal fluid leak  . Left foot surgery    . Cesarean section      X 2  . Dilation and curettage of uterus      X 3  . Colonoscopy  08/28/2011    Dr.Lakrista Scaduto- suboptimal prep. I- 50mm oval polyp at the splenic flexure. remainder of colonic and rectal mucosa appeared normal. bx= hyperplastic polyp.  . Pilonidal cyst removed    . Csf leaf    . Colonoscopy N/A 05/16/2015    Dr.Rose Hegner- normal appearing rectal mucosa, scattered L sided diverticula, the remainder of the colonic mucosa appeared normal  . Esophagogastroduodenoscopy N/A 05/16/2015    Dr.Renne Cornick- elliptical pyloric channel ulcer, gastric erosions, Hiatal hernia bx= benign mucosa    Prior to Admission medications   Medication Sig Start Date End Date Taking? Authorizing Provider  acetaminophen (TYLENOL) 650 MG CR tablet  Take 1,300 mg by mouth every 8 (eight) hours as needed for pain.   Yes Historical Provider, MD  albuterol (PROVENTIL HFA;VENTOLIN HFA) 108 (90 BASE) MCG/ACT inhaler Inhale into the lungs every 6 (six) hours as needed for wheezing or shortness of breath.   Yes Historical Provider, MD  amLODipine (NORVASC) 10 MG tablet Take 5 mg by mouth daily.    Yes Historical Provider, MD  dapagliflozin propanediol (FARXIGA) 5 MG TABS tablet Take 5 mg by mouth daily.   Yes Historical Provider, MD  dicyclomine (BENTYL) 10 MG capsule Take 10 mg by mouth 2 (two) times daily.   Yes Historical Provider, MD  losartan (COZAAR) 100 MG tablet Take 100 mg by mouth daily.     Yes Historical Provider, MD  mometasone (NASONEX) 50 MCG/ACT nasal spray Place 2 sprays into the nose daily as needed (congestion).   Yes Historical Provider, MD  montelukast (SINGULAIR) 10 MG tablet Take 10 mg by mouth at bedtime.     Yes Historical Provider, MD  Multiple Vitamins-Minerals (MULTIVITAMINS THER. W/MINERALS) TABS Take 1 tablet by mouth daily.     Yes Historical Provider, MD  naproxen (NAPROSYN) 500 MG tablet Take 500 mg by mouth 2 (two) times daily with a meal. Pain   Yes Historical Provider, MD  omeprazole (PRILOSEC) 20 MG capsule Take 20 mg by mouth daily.     Yes Historical Provider, MD  ondansetron (  ZOFRAN) 4 MG tablet Take 1 tablet (4 mg total) by mouth every 6 (six) hours. 10/30/13  Yes Ignacia Felling, PA-C  levofloxacin (LEVAQUIN) 500 MG tablet Take 500 mg by mouth daily.    Historical Provider, MD  metFORMIN (GLUCOPHAGE) 500 MG tablet Take 500 mg by mouth 2 (two) times daily with a meal.      Historical Provider, MD  polyethylene glycol-electrolytes (NULYTELY/GOLYTELY) 420 G solution Take 4,000 mLs by mouth once. Patient not taking: Reported on 07/19/2015 04/23/15   Daneil Dolin, MD    Allergies as of 07/19/2015 - Review Complete 07/19/2015  Allergen Reaction Noted  . Other Anaphylaxis and Swelling 08/18/2011  . Codeine Other  (See Comments) 08/05/2011  . Hydrocodone-acetaminophen Nausea Only 02/16/2013  . Sulfa antibiotics Other (See Comments) 08/05/2011  . Tetracyclines & related  10/29/2013    Family History  Problem Relation Age of Onset  . Colon cancer Neg Hx     Social History   Social History  . Marital Status: Married    Spouse Name: N/A  . Number of Children: N/A  . Years of Education: N/A   Occupational History  . Not on file.   Social History Main Topics  . Smoking status: Former Smoker -- 1.00 packs/day for 29 years    Types: Cigarettes  . Smokeless tobacco: Not on file     Comment: Quit x 10 years  . Alcohol Use: No  . Drug Use: No  . Sexual Activity: Not on file   Other Topics Concern  . Not on file   Social History Narrative    Review of Systems: See HPI, otherwise negative ROS  Physical Exam: BP 139/85 mmHg  Pulse 78  Temp(Src) 98.3 F (36.8 C) (Oral)  Ht 5\' 5"  (1.651 m)  Wt 203 lb 9.6 oz (92.352 kg)  BMI 33.88 kg/m2 General:   Well-developed, well-nourished, pleasant and cooperative in NAD Skin:  Intact without significant lesions or rashes. Eyes:  Sclera clear, no icterus.   Conjunctiva pink. Neck:  Supple; no masses or thyromegaly. No significant cervical adenopathy. Lungs:  Clear throughout to auscultation.   No wheezes, crackles, or rhonchi. No acute distress. Heart:  Regular rate and rhythm; no murmurs, clicks, rubs,  or gallops. Abdomen: Non-distended, normal bowel sounds.  Soft and nontender without appreciable mass or hepatosplenomegaly.   Impression:   59 year old lady with NSAID-induced peptic ulcer disease. Clinically, doing well from a GI standpoint at this time. Metformin related diarrhea resolved with cessation of this medication. Reflux symptoms well controlled on twice a day PPI therapy.  Now on tramadol for management of arthritis symptoms in lieu of Naprosyn. The need for follow-up EGD reviewed with the patient.  Recommendations:   Follow-up EGD  - re-assess gastric ulcer.  The risks, benefits, limitations, alternatives and imponderables have been reviewed with the patient. Potential for esophageal dilation, biopsy, etc. have also been reviewed.  Questions have been answered. All parties agreeable.  Continue prilosec 20 mg twice daily  Avoid NSAID medications  Further recommendations to follow        Notice: This dictation was prepared with Dragon dictation along with smaller phrase technology. Any transcriptional errors that result from this process are unintentional and may not be corrected upon review.

## 2015-07-19 NOTE — Patient Instructions (Signed)
Follow-up EGD - re-assess gastric ulcer  Continue prilosec 20 mg twice daily  Avoid NSAID medications  Further recommendations to follow

## 2015-08-09 ENCOUNTER — Ambulatory Visit (HOSPITAL_COMMUNITY)
Admission: RE | Admit: 2015-08-09 | Discharge: 2015-08-09 | Disposition: A | Discharge status: Home / Self Care | Payer: Managed Care, Other (non HMO) | Source: Ambulatory Visit | Attending: Internal Medicine | Admitting: Internal Medicine

## 2015-08-09 ENCOUNTER — Encounter (HOSPITAL_COMMUNITY)
Admission: RE | Disposition: A | Discharge status: Home / Self Care | Payer: Self-pay | Source: Ambulatory Visit | Attending: Internal Medicine

## 2015-08-09 ENCOUNTER — Encounter (HOSPITAL_COMMUNITY): Payer: Self-pay | Admitting: *Deleted

## 2015-08-09 DIAGNOSIS — Z87891 Personal history of nicotine dependence: Secondary | ICD-10-CM | POA: Diagnosis not present

## 2015-08-09 DIAGNOSIS — Z8719 Personal history of other diseases of the digestive system: Secondary | ICD-10-CM

## 2015-08-09 DIAGNOSIS — K219 Gastro-esophageal reflux disease without esophagitis: Secondary | ICD-10-CM | POA: Insufficient documentation

## 2015-08-09 DIAGNOSIS — I10 Essential (primary) hypertension: Secondary | ICD-10-CM | POA: Diagnosis not present

## 2015-08-09 DIAGNOSIS — Z79899 Other long term (current) drug therapy: Secondary | ICD-10-CM | POA: Diagnosis not present

## 2015-08-09 DIAGNOSIS — K259 Gastric ulcer, unspecified as acute or chronic, without hemorrhage or perforation: Secondary | ICD-10-CM | POA: Diagnosis present

## 2015-08-09 DIAGNOSIS — Z8711 Personal history of peptic ulcer disease: Secondary | ICD-10-CM | POA: Insufficient documentation

## 2015-08-09 DIAGNOSIS — E119 Type 2 diabetes mellitus without complications: Secondary | ICD-10-CM | POA: Diagnosis not present

## 2015-08-09 DIAGNOSIS — K449 Diaphragmatic hernia without obstruction or gangrene: Secondary | ICD-10-CM | POA: Insufficient documentation

## 2015-08-09 HISTORY — PX: ESOPHAGOGASTRODUODENOSCOPY: SHX5428

## 2015-08-09 LAB — GLUCOSE, CAPILLARY: Glucose-Capillary: 153 mg/dL — ABNORMAL HIGH (ref 65–99)

## 2015-08-09 SURGERY — EGD (ESOPHAGOGASTRODUODENOSCOPY)
Anesthesia: Moderate Sedation

## 2015-08-09 MED ORDER — LIDOCAINE VISCOUS 2 % MT SOLN
OROMUCOSAL | Status: AC
  Filled 2015-08-09: qty 15

## 2015-08-09 MED ORDER — LIDOCAINE VISCOUS 2 % MT SOLN
OROMUCOSAL | Status: DC | PRN
  Administered 2015-08-09: 4 mL via OROMUCOSAL

## 2015-08-09 MED ORDER — MEPERIDINE HCL 100 MG/ML IJ SOLN
INTRAMUSCULAR | Status: AC
  Filled 2015-08-09: qty 2

## 2015-08-09 MED ORDER — MEPERIDINE HCL 100 MG/ML IJ SOLN
INTRAMUSCULAR | Status: DC | PRN
  Administered 2015-08-09: 50 mg via INTRAVENOUS
  Administered 2015-08-09: 25 mg via INTRAVENOUS

## 2015-08-09 MED ORDER — MIDAZOLAM HCL 5 MG/5ML IJ SOLN
INTRAMUSCULAR | Status: DC | PRN
  Administered 2015-08-09: 1 mg via INTRAVENOUS
  Administered 2015-08-09: 2 mg via INTRAVENOUS

## 2015-08-09 MED ORDER — ONDANSETRON HCL 4 MG/2ML IJ SOLN
INTRAMUSCULAR | Status: DC | PRN
  Administered 2015-08-09: 4 mg via INTRAVENOUS

## 2015-08-09 MED ORDER — MIDAZOLAM HCL 5 MG/5ML IJ SOLN
INTRAMUSCULAR | Status: AC
  Filled 2015-08-09: qty 10

## 2015-08-09 MED ORDER — ONDANSETRON HCL 4 MG/2ML IJ SOLN
INTRAMUSCULAR | Status: AC
  Filled 2015-08-09: qty 2

## 2015-08-09 MED ORDER — STERILE WATER FOR IRRIGATION IR SOLN
Status: DC | PRN
  Administered 2015-08-09: 08:00:00

## 2015-08-09 NOTE — Discharge Instructions (Signed)
EGD Discharge instructions Please read the instructions outlined below and refer to this sheet in the next few weeks. These discharge instructions provide you with general information on caring for yourself after you leave the hospital. Your doctor may also give you specific instructions. While your treatment has been planned according to the most current medical practices available, unavoidable complications occasionally occur. If you have any problems or questions after discharge, please call your doctor. ACTIVITY  You may resume your regular activity but move at a slower pace for the next 24 hours.   Take frequent rest periods for the next 24 hours.   Walking will help expel (get rid of) the air and reduce the bloated feeling in your abdomen.   No driving for 24 hours (because of the anesthesia (medicine) used during the test).   You may shower.   Do not sign any important legal documents or operate any machinery for 24 hours (because of the anesthesia used during the test).  NUTRITION  Drink plenty of fluids.   You may resume your normal diet.   Begin with a light meal and progress to your normal diet.   Avoid alcoholic beverages for 24 hours or as instructed by your caregiver.  MEDICATIONS  You may resume your normal medications unless your caregiver tells you otherwise.  WHAT YOU CAN EXPECT TODAY  You may experience abdominal discomfort such as a feeling of fullness or gas pains.  FOLLOW-UP  Your doctor will discuss the results of your test with you.  SEEK IMMEDIATE MEDICAL ATTENTION IF ANY OF THE FOLLOWING OCCUR:  Excessive nausea (feeling sick to your stomach) and/or vomiting.   Severe abdominal pain and distention (swelling).   Trouble swallowing.   Temperature over 101 F (37.8 C).   Rectal bleeding or vomiting of blood.    May decrease omeprazole 20 mg once daily as long as reflux symptoms are adequately controlled  Avoid nonsteroidals as much as  possible going forward  Office visit with [place] in one year

## 2015-08-09 NOTE — Interval H&P Note (Signed)
History and Physical Interval Note:  08/09/2015 7:34 AM  Rebecca Burke  has presented today for surgery, with the diagnosis of gastric ulcer  The various methods of treatment have been discussed with the patient and family. After consideration of risks, benefits and other options for treatment, the patient has consented to  Procedure(s) with comments: ESOPHAGOGASTRODUODENOSCOPY (EGD) (N/A) - 0730 as a surgical intervention .  The patient's history has been reviewed, patient examined, no change in status, stable for surgery.  I have reviewed the patient's chart and labs.  Questions were answered to the patient's satisfaction.     [name]  No change.  The risks, benefits, limitations, alternatives and imponderables have been reviewed with the patient. Potential for esophageal dilation, biopsy, etc. have also been reviewed.  Questions have been answered. All parties agreeable.

## 2015-08-09 NOTE — Op Note (Signed)
[facility] [address] [place], [address]   ENDOSCOPY PROCEDURE REPORT  PATIENT: Rebecca Burke, Rebecca Burke  MR#: [id number] BIRTHDATE: [birth year] , 17  yrs. old GENDER: female ENDOSCOPIST: R.  Garfield Cornea, MD FACP FACG REFERRED BY:  Micheline Rough, M.D. PROCEDURE DATE:  September 06, 2015 PROCEDURE:  EGD, diagnostic INDICATIONS:  History gastric ulcer. MEDICATIONS: Versed 3 mg IV and Demerol 75 mg IV in divided doses. Xylocaine gel orally.  Zofran 4 mg IV. ASA CLASS:      Class II  CONSENT: The risks, benefits, limitations, alternatives and imponderables have been discussed.  The potential for biopsy, esophogeal dilation, etc. have also been reviewed.  Questions have been answered.  All parties agreeable.  Please see the history and physical in the medical record for more information.  DESCRIPTION OF PROCEDURE: After the risks benefits and alternatives of the procedure were thoroughly explained, informed consent was obtained.  The [id number] endoscope was introduced through the mouth and advanced to the second portion of the duodenum , limited by Without limitations. The instrument was slowly withdrawn as the mucosa was fully examined. Estimated blood loss is zero unless otherwise noted in this procedure report.    Normal appearing tubular esophagus.  Stomach empty.  Small hiatal hernia.  Previously noted pyloric channel ulcer completely healed with minimal scar formation.  Examination of the bulb and second portion revealed no abnormalities.  Retroflexed views revealed as previously described.     The scope was then withdrawn from the patient and the procedure completed.  COMPLICATIONS: There were no immediate complications.  ENDOSCOPIC IMPRESSION: Small hiatal hernia. Previously noted gastric ulcer completely healed.  RECOMMENDATIONS: Decrease omeprazole to 20 mg once daily for GERD. Avoid nonsteroidals. Office visit with [place] in one year.  REPEAT  EXAM:  eSigned:  R. Garfield Cornea, MD Rosalita Chessman [facility] September 06, 2015 7:55 AM    CC:  CPT CODES: ICD CODES:  The ICD and CPT codes recommended by this software are interpretations from the data that the clinical staff has captured with the software.  The verification of the translation of this report to the ICD and CPT codes and modifiers is the sole responsibility of the health care institution and practicing physician where this report was generated.  [employer] will not be held responsible for the validity of the ICD and CPT codes included on this report.  AMA assumes no liability for data contained or not contained herein. CPT is a Designer, television/film set of the [employer].  PATIENT NAME:  Rebecca Burke, Rebecca Burke MR#: [id number]

## 2015-08-09 NOTE — H&P (View-Only) (Signed)
Primary Care Physician:  Rocky Morel, MD Primary Gastroenterologist:  Dr. Gala Romney  Pre-Procedure History & Physical: HPI:  Rebecca Burke is a 59 y.o. female here for  Follow-up of diarrhea and peptic ulcer disease. Patient is pleased;  states diarrhea  resolved with cessation of metformin. She's very happy about this improvement. She's not had any abdominal pain. Taking Naprosyn. Gastric ulcer diagnosed recently- biopsies negative for malignancy or H. Pylori. Reflux symptoms now well controlled on omeprazole 20 mg twice daily. She will be due for repeat EGD to verify ulcer healing in 2-3 weeks. Extensive evaluation for chronic diarrhea including stool studies, celiac screen and colon biopsies all negative.    Past Medical History  Diagnosis Date  . Diabetes mellitus   . Hypertension   . GERD (gastroesophageal reflux disease)   . Endometriosis   . Meningitis   . Hyperplastic colon polyp 2012  . Diverticulosis   . Gastric ulcer     Past Surgical History  Procedure Laterality Date  . Back surgery    . Abdominal hysterectomy    . Cholecystectomy    . Hernia repair    . Brain surgery      Cerebro spinal fluid leak  . Left foot surgery    . Cesarean section      X 2  . Dilation and curettage of uterus      X 3  . Colonoscopy  08/28/2011    Dr.Minola Guin- suboptimal prep. I- 15mm oval polyp at the splenic flexure. remainder of colonic and rectal mucosa appeared normal. bx= hyperplastic polyp.  . Pilonidal cyst removed    . Csf leaf    . Colonoscopy N/A 05/16/2015    Dr.Krisy Dix- normal appearing rectal mucosa, scattered L sided diverticula, the remainder of the colonic mucosa appeared normal  . Esophagogastroduodenoscopy N/A 05/16/2015    Dr.Kleigh Hoelzer- elliptical pyloric channel ulcer, gastric erosions, Hiatal hernia bx= benign mucosa    Prior to Admission medications   Medication Sig Start Date End Date Taking? Authorizing Provider  acetaminophen (TYLENOL) 650 MG CR tablet  Take 1,300 mg by mouth every 8 (eight) hours as needed for pain.   Yes Historical Provider, MD  albuterol (PROVENTIL HFA;VENTOLIN HFA) 108 (90 BASE) MCG/ACT inhaler Inhale into the lungs every 6 (six) hours as needed for wheezing or shortness of breath.   Yes Historical Provider, MD  amLODipine (NORVASC) 10 MG tablet Take 5 mg by mouth daily.    Yes Historical Provider, MD  dapagliflozin propanediol (FARXIGA) 5 MG TABS tablet Take 5 mg by mouth daily.   Yes Historical Provider, MD  dicyclomine (BENTYL) 10 MG capsule Take 10 mg by mouth 2 (two) times daily.   Yes Historical Provider, MD  losartan (COZAAR) 100 MG tablet Take 100 mg by mouth daily.     Yes Historical Provider, MD  mometasone (NASONEX) 50 MCG/ACT nasal spray Place 2 sprays into the nose daily as needed (congestion).   Yes Historical Provider, MD  montelukast (SINGULAIR) 10 MG tablet Take 10 mg by mouth at bedtime.     Yes Historical Provider, MD  Multiple Vitamins-Minerals (MULTIVITAMINS THER. W/MINERALS) TABS Take 1 tablet by mouth daily.     Yes Historical Provider, MD  naproxen (NAPROSYN) 500 MG tablet Take 500 mg by mouth 2 (two) times daily with a meal. Pain   Yes Historical Provider, MD  omeprazole (PRILOSEC) 20 MG capsule Take 20 mg by mouth daily.     Yes Historical Provider, MD  ondansetron (  ZOFRAN) 4 MG tablet Take 1 tablet (4 mg total) by mouth every 6 (six) hours. 10/30/13  Yes Ignacia Felling, PA-C  levofloxacin (LEVAQUIN) 500 MG tablet Take 500 mg by mouth daily.    Historical Provider, MD  metFORMIN (GLUCOPHAGE) 500 MG tablet Take 500 mg by mouth 2 (two) times daily with a meal.      Historical Provider, MD  polyethylene glycol-electrolytes (NULYTELY/GOLYTELY) 420 G solution Take 4,000 mLs by mouth once. Patient not taking: Reported on 07/19/2015 04/23/15   Daneil Dolin, MD    Allergies as of 07/19/2015 - Review Complete 07/19/2015  Allergen Reaction Noted  . Other Anaphylaxis and Swelling 08/18/2011  . Codeine Other  (See Comments) 08/05/2011  . Hydrocodone-acetaminophen Nausea Only 02/16/2013  . Sulfa antibiotics Other (See Comments) 08/05/2011  . Tetracyclines & related  10/29/2013    Family History  Problem Relation Age of Onset  . Colon cancer Neg Hx     Social History   Social History  . Marital Status: Married    Spouse Name: N/A  . Number of Children: N/A  . Years of Education: N/A   Occupational History  . Not on file.   Social History Main Topics  . Smoking status: Former Smoker -- 1.00 packs/day for 29 years    Types: Cigarettes  . Smokeless tobacco: Not on file     Comment: Quit x 10 years  . Alcohol Use: No  . Drug Use: No  . Sexual Activity: Not on file   Other Topics Concern  . Not on file   Social History Narrative    Review of Systems: See HPI, otherwise negative ROS  Physical Exam: BP 139/85 mmHg  Pulse 78  Temp(Src) 98.3 F (36.8 C) (Oral)  Ht 5\' 5"  (1.651 m)  Wt 203 lb 9.6 oz (92.352 kg)  BMI 33.88 kg/m2 General:   Well-developed, well-nourished, pleasant and cooperative in NAD Skin:  Intact without significant lesions or rashes. Eyes:  Sclera clear, no icterus.   Conjunctiva pink. Neck:  Supple; no masses or thyromegaly. No significant cervical adenopathy. Lungs:  Clear throughout to auscultation.   No wheezes, crackles, or rhonchi. No acute distress. Heart:  Regular rate and rhythm; no murmurs, clicks, rubs,  or gallops. Abdomen: Non-distended, normal bowel sounds.  Soft and nontender without appreciable mass or hepatosplenomegaly.   Impression:   59 year old lady with NSAID-induced peptic ulcer disease. Clinically, doing well from a GI standpoint at this time. Metformin related diarrhea resolved with cessation of this medication. Reflux symptoms well controlled on twice a day PPI therapy.  Now on tramadol for management of arthritis symptoms in lieu of Naprosyn. The need for follow-up EGD reviewed with the patient.  Recommendations:   Follow-up EGD  - re-assess gastric ulcer.  The risks, benefits, limitations, alternatives and imponderables have been reviewed with the patient. Potential for esophageal dilation, biopsy, etc. have also been reviewed.  Questions have been answered. All parties agreeable.  Continue prilosec 20 mg twice daily  Avoid NSAID medications  Further recommendations to follow        Notice: This dictation was prepared with Dragon dictation along with smaller phrase technology. Any transcriptional errors that result from this process are unintentional and may not be corrected upon review.

## 2015-08-12 ENCOUNTER — Encounter (HOSPITAL_COMMUNITY): Payer: Self-pay | Admitting: Internal Medicine

## 2015-08-19 ENCOUNTER — Ambulatory Visit: Payer: Self-pay | Admitting: Nurse Practitioner

## 2015-10-10 ENCOUNTER — Telehealth: Payer: Self-pay

## 2015-10-10 NOTE — Telephone Encounter (Signed)
Pt called- left voicemail- she is trying not to take any anti-inflammatories d/t RMR telling her to not take anymore. She said she is having a real hard time with inflammation and wanted to know if it would be all right for her to take tumeric capsules everyday? She heard it was good for inflammation

## 2015-10-11 NOTE — Telephone Encounter (Signed)
Called pt- NA-LMOM with information.

## 2015-10-11 NOTE — Telephone Encounter (Signed)
I am OK with Tumeric;  If further intervention needed, I suggest she contact PCP about a new medication regimen

## 2016-02-14 ENCOUNTER — Ambulatory Visit (INDEPENDENT_AMBULATORY_CARE_PROVIDER_SITE_OTHER): Payer: Managed Care, Other (non HMO) | Admitting: Ophthalmology

## 2016-02-14 DIAGNOSIS — E113293 Type 2 diabetes mellitus with mild nonproliferative diabetic retinopathy without macular edema, bilateral: Secondary | ICD-10-CM

## 2016-02-14 DIAGNOSIS — H43813 Vitreous degeneration, bilateral: Secondary | ICD-10-CM | POA: Diagnosis not present

## 2016-02-14 DIAGNOSIS — H2513 Age-related nuclear cataract, bilateral: Secondary | ICD-10-CM

## 2016-02-14 DIAGNOSIS — E11319 Type 2 diabetes mellitus with unspecified diabetic retinopathy without macular edema: Secondary | ICD-10-CM | POA: Diagnosis not present

## 2016-02-14 DIAGNOSIS — H35033 Hypertensive retinopathy, bilateral: Secondary | ICD-10-CM

## 2016-02-14 DIAGNOSIS — I10 Essential (primary) hypertension: Secondary | ICD-10-CM | POA: Diagnosis not present

## 2016-04-03 ENCOUNTER — Other Ambulatory Visit (HOSPITAL_COMMUNITY): Payer: Self-pay | Admitting: Family Medicine

## 2016-04-03 DIAGNOSIS — Z1231 Encounter for screening mammogram for malignant neoplasm of breast: Secondary | ICD-10-CM

## 2016-05-22 ENCOUNTER — Ambulatory Visit (HOSPITAL_COMMUNITY)
Admission: RE | Admit: 2016-05-22 | Discharge: 2016-05-22 | Disposition: A | Discharge status: Home / Self Care | Payer: 59 | Source: Ambulatory Visit | Attending: Family Medicine | Admitting: Family Medicine

## 2016-05-22 DIAGNOSIS — Z1231 Encounter for screening mammogram for malignant neoplasm of breast: Secondary | ICD-10-CM | POA: Insufficient documentation

## 2016-08-11 ENCOUNTER — Encounter: Payer: Self-pay | Admitting: Internal Medicine

## 2016-11-13 ENCOUNTER — Other Ambulatory Visit: Payer: Self-pay

## 2016-11-13 ENCOUNTER — Ambulatory Visit (INDEPENDENT_AMBULATORY_CARE_PROVIDER_SITE_OTHER): Payer: 59 | Admitting: Gastroenterology

## 2016-11-13 ENCOUNTER — Encounter: Payer: Self-pay | Admitting: Gastroenterology

## 2016-11-13 VITALS — BP 136/86 | HR 91 | Temp 97.9°F | Ht 65.0 in | Wt 215.0 lb

## 2016-11-13 DIAGNOSIS — K219 Gastro-esophageal reflux disease without esophagitis: Secondary | ICD-10-CM | POA: Diagnosis not present

## 2016-11-13 DIAGNOSIS — M25579 Pain in unspecified ankle and joints of unspecified foot: Secondary | ICD-10-CM

## 2016-11-13 DIAGNOSIS — M2550 Pain in unspecified joint: Secondary | ICD-10-CM

## 2016-11-13 MED ORDER — PANTOPRAZOLE SODIUM 40 MG PO TBEC: 40.0000 mg | DELAYED_RELEASE_TABLET | Freq: Two times a day (BID) | ORAL | 3 refills | Status: DC

## 2016-11-13 NOTE — Assessment & Plan Note (Signed)
61 year old female with history of chronic GERD, now worsening in the setting of weight gain. Globus sensation noted, likely secondary to uncontrolled GERD. No outright esophageal dysphagia. Epigastric discomfort noted as well, likely all stemming from uncontrolled GERD, gastritis. Last EGD in 2016 with healed PUD. Has been on Prilosec chronically. Will trial Protonix BID, dietary and behavior measures, and have her call in 2 weeks with an update. If no improvement, will need EGD+/- dilation. Return in 3 months regardless.

## 2016-11-13 NOTE — Patient Instructions (Addendum)
I sent Protonix to your pharmacy. Take this 30 minutes before a meal on an empty stomach, twice a day.   I have referred you to a rheumatologist.  You may try taking a probiotic over the counter such as Align, Restora, Digestive Advantage, [place].   Please let me know how you are doing in 2 weeks. If no improvement, we will need to pursue an upper endoscopy.   I will see you in 3 months regardless  I have attached a handout on foods to avoid due to reflux symptoms.   Food Choices for Gastroesophageal Reflux Disease, Adult When you have gastroesophageal reflux disease (GERD), the foods you eat and your eating habits are very important. Choosing the right foods can help ease your discomfort. What guidelines do I need to follow?  Choose fruits, vegetables, whole grains, and low-fat dairy products.  Choose low-fat meat, fish, and poultry.  Limit fats such as oils, salad dressings, butter, nuts, and avocado.  Keep a food diary. This helps you identify foods that cause symptoms.  Avoid foods that cause symptoms. These may be different for everyone.  Eat small meals often instead of 3 large meals a day.  Eat your meals slowly, in a place where you are relaxed.  Limit fried foods.  Cook foods using methods other than frying.  Avoid drinking alcohol.  Avoid drinking large amounts of liquids with your meals.  Avoid bending over or lying down until 2-3 hours after eating. What foods are not recommended? These are some foods and drinks that may make your symptoms worse: Vegetables  Tomatoes. Tomato juice. Tomato and spaghetti sauce. Chili peppers. Onion and garlic. Horseradish. Fruits  Oranges, grapefruit, and lemon (fruit and juice). Meats  High-fat meats, fish, and poultry. This includes hot dogs, ribs, ham, sausage, salami, and bacon. Dairy  Whole milk and chocolate milk. Sour cream. Cream. Butter. Ice cream. Cream cheese. Drinks  Coffee and tea. Bubbly  (carbonated) drinks or energy drinks. Condiments  Hot sauce. Barbecue sauce. Sweets/Desserts  Chocolate and cocoa. Donuts. Peppermint and spearmint. Fats and Oils  High-fat foods. This includes [place] fries and potato chips. Other  Vinegar. Strong spices. This includes black pepper, white pepper, red pepper, cayenne, curry powder, cloves, ginger, and chili powder. The items listed above may not be a complete list of foods and drinks to avoid. Contact your dietitian for more information.  This information is not intended to replace advice given to you by your health care provider. Make sure you discuss any questions you have with your health care provider. Document Released: 02/16/2012 Document Revised: 01/23/2016 Document Reviewed: 06/21/2013 Elsevier Interactive Patient Education  2017 [employer].

## 2016-11-13 NOTE — Progress Notes (Signed)
cc'ed to pcp °

## 2016-11-13 NOTE — Assessment & Plan Note (Signed)
Chronic joint pain, "aching" all over. We have discussed avoidance of NSAIDs due to history of PUD. She would like further evaluation, so I have referred her to rheumatology.

## 2016-11-13 NOTE — Progress Notes (Signed)
Primary Care Physician:  Purvis Kilts, MD Primary GI: Dr. Gala Romney   Chief Complaint  Patient presents with  . Gastroesophageal Reflux    worse when lays down; tightness in chest  . Abdominal Pain    mid upper abd, "stomach rumbles"    HPI:   Rebecca Burke is a 61 y.o. female presenting today with a history of GERD, PUD, diarrhea. Last seen in Dec 2016 with EGD noting previously noted ulcer healed, colonoscopy with colonic diverticulosis s/p segmental biopsy with benign path.    Weight stable upon review of epic records. Has been having worsening of GERD symptoms when she started gaining weight. Used to never have issues until she laid down at night. Has globus sensation in cervical area. Reflux burning and coming up all the time. Stomach gurgles all the time. Went back to taking Bentyl, which she had left over from days when she had diarrhea. Only taking one a day and helped. Notes epigastric pain, which is intermittent but more often there than not. Unsure if worse after eating. Has gained a lot of weight because when she was taken off metformin, she was placed on a different agent and started gaining weight. Sometimes just rare nausea but no vomiting. No problems with appetite. Feels hoarse lately but can't clear throat. Will sometimes eat before going to bed but not all the time; she states she is really trying to avoid this but works till 2 am and usually eats last meal about 11pm and goes to bed about 4:30 am or 5am. Not snacking anymore before going to bed. Prilosec BID. Has never tried any other agents other than this. Gallbladder absent. Avoiding NSAIDs or aspirin powders.   Diarrhea much improved since coming off of metformin. Will have spells but not all the time.   Aches all over, has arthritis, believes she may have fibromyalgia.     Past Medical History:  Diagnosis Date  . Diabetes mellitus   . Diverticulosis   . Endometriosis   . Gastric ulcer   . GERD  (gastroesophageal reflux disease)   . Hyperplastic colon polyp 2012  . Hypertension   . Meningitis     Past Surgical History:  Procedure Laterality Date  . ABDOMINAL HYSTERECTOMY    . BACK SURGERY    . BRAIN SURGERY     Cerebro spinal fluid leak  . CESAREAN SECTION     X 2  . CHOLECYSTECTOMY    . COLONOSCOPY  08/28/2011   Dr.Rourk- suboptimal prep. I- 2mm oval polyp at the splenic flexure. remainder of colonic and rectal mucosa appeared normal. bx= hyperplastic polyp.  . COLONOSCOPY N/A 05/16/2015   Dr.Rourk- normal appearing rectal mucosa, scattered L sided diverticula, the remainder of the colonic mucosa appeared normal. Normal segmental biopsies   . CSF LEAF    . DILATION AND CURETTAGE OF UTERUS     X 3  . ESOPHAGOGASTRODUODENOSCOPY N/A 05/16/2015   Dr.Rourk- elliptical pyloric channel ulcer, gastric erosions, Hiatal hernia bx= benign mucosa  . ESOPHAGOGASTRODUODENOSCOPY N/A 08/09/2015   Dr. Gala Romney: previously noted gastric ulcer healed. small hiatal hernia   . HERNIA REPAIR    . left foot surgery    . PILONIDAL CYST REMOVED      Current Outpatient Prescriptions  Medication Sig Dispense Refill  . acetaminophen (TYLENOL) 650 MG CR tablet Take 1,300 mg by mouth every 8 (eight) hours as needed for pain.    Marland Kitchen albuterol (PROVENTIL HFA;VENTOLIN HFA) 108 (90  BASE) MCG/ACT inhaler Inhale into the lungs every 6 (six) hours as needed for wheezing or shortness of breath.    Marland Kitchen amLODipine (NORVASC) 10 MG tablet Take 5 mg by mouth daily.     Marland Kitchen dicyclomine (BENTYL) 10 MG capsule Take 10 mg by mouth daily.     . empagliflozin (JARDIANCE) 10 MG TABS tablet Take 10 mg by mouth daily.    Marland Kitchen losartan (COZAAR) 100 MG tablet Take 100 mg by mouth daily.      . mometasone (NASONEX) 50 MCG/ACT nasal spray Place 2 sprays into the nose daily as needed (congestion).    . montelukast (SINGULAIR) 10 MG tablet Take 10 mg by mouth daily.     . Multiple Vitamins-Minerals (MULTIVITAMINS THER. W/MINERALS) TABS  Take 1 tablet by mouth daily.      Marland Kitchen omeprazole (PRILOSEC) 20 MG capsule Take 20 mg by mouth 2 (two) times daily.     . dapagliflozin propanediol (FARXIGA) 5 MG TABS tablet Take 5 mg by mouth daily.    Marland Kitchen levofloxacin (LEVAQUIN) 500 MG tablet Take 500 mg by mouth daily.    . metFORMIN (GLUCOPHAGE) 500 MG tablet Take 500 mg by mouth 2 (two) times daily with a meal.      . naproxen (NAPROSYN) 500 MG tablet Take 500 mg by mouth 2 (two) times daily with a meal. Pain    . ondansetron (ZOFRAN) 4 MG tablet Take 1 tablet (4 mg total) by mouth every 6 (six) hours. (Patient not taking: Reported on 08/06/2015) 12 tablet 0   No current facility-administered medications for this visit.     Allergies as of 11/13/2016 - Review Complete 11/13/2016  Allergen Reaction Noted  . Other Anaphylaxis and Swelling 08/18/2011  . Codeine Other (See Comments) 08/05/2011  . Hydrocodone-acetaminophen Nausea Only 02/16/2013  . Sulfa antibiotics Other (See Comments) 08/05/2011  . Tetracyclines & related  10/29/2013    Family History  Problem Relation Age of Onset  . Colon cancer Neg Hx     Social History   Social History  . Marital status: Married    Spouse name: N/A  . Number of children: N/A  . Years of education: N/A   Social History Main Topics  . Smoking status: Former Smoker    Packs/day: 1.00    Years: 29.00    Types: Cigarettes  . Smokeless tobacco: Never Used     Comment: Quit x 10 years  . Alcohol use No  . Drug use: No  . Sexual activity: Not Asked   Other Topics Concern  . None   Social History Narrative  . None    Review of Systems: As mentioned in HPI   Physical Exam: BP 136/86   Pulse 91   Temp 97.9 F (36.6 C) (Oral)   Ht 5\' 5"  (1.651 m)   Wt 215 lb (97.5 kg)   BMI 35.78 kg/m  General:   Alert and oriented. No distress noted. Pleasant and cooperative.  Head:  Normocephalic and atraumatic. Eyes:  Conjuctiva clear without scleral icterus. Mouth:  Oral mucosa pink and  moist. Good dentition.  Heart:  S1, S2 present without murmurs, rubs, or gallops. Regular rate and rhythm. Abdomen:  +BS, soft, mild tenderness epigastric and non-distended. No rebound or guarding. No HSM or masses noted. Extremities:  Without edema. Neurologic:  Alert and  oriented x4;  grossly normal neurologically. Psych:  Alert and cooperative. Normal mood and affect.

## 2016-11-27 DIAGNOSIS — J069 Acute upper respiratory infection, unspecified: Secondary | ICD-10-CM | POA: Diagnosis not present

## 2016-11-27 DIAGNOSIS — J209 Acute bronchitis, unspecified: Secondary | ICD-10-CM | POA: Diagnosis not present

## 2016-12-04 ENCOUNTER — Encounter: Payer: Self-pay | Admitting: Obstetrics & Gynecology

## 2016-12-04 ENCOUNTER — Ambulatory Visit (INDEPENDENT_AMBULATORY_CARE_PROVIDER_SITE_OTHER): Payer: 59 | Admitting: Obstetrics & Gynecology

## 2016-12-04 VITALS — BP 130/84 | HR 75 | Ht 65.0 in | Wt 209.0 lb

## 2016-12-04 DIAGNOSIS — Z01419 Encounter for gynecological examination (general) (routine) without abnormal findings: Secondary | ICD-10-CM | POA: Diagnosis not present

## 2016-12-04 NOTE — Progress Notes (Signed)
Subjective:     Rebecca Burke is a 61 y.o. female here for a routine exam.  No LMP recorded. Patient has had a hysterectomy. No obstetric history on file. Birth Control Method:  hysterectomy Menstrual Calendar(currently): na  Current complaints: none.   Current acute medical issues:  none   Recent Gynecologic History No LMP recorded. Patient has had a hysterectomy. Last Pap: ,   Last mammogram: 05/22/2016,  normal  Past Medical History:  Diagnosis Date  . Diabetes mellitus   . Diverticulosis   . Endometriosis   . Gastric ulcer   . GERD (gastroesophageal reflux disease)   . Hyperplastic colon polyp 2012  . Hypertension   . Meningitis     Past Surgical History:  Procedure Laterality Date  . ABDOMINAL HYSTERECTOMY    . BACK SURGERY    . BRAIN SURGERY     Cerebro spinal fluid leak  . CARPAL TUNNEL RELEASE    . CESAREAN SECTION     X 2  . CHOLECYSTECTOMY    . COLONOSCOPY  08/28/2011   Dr.Rourk- suboptimal prep. I- 12mm oval polyp at the splenic flexure. remainder of colonic and rectal mucosa appeared normal. bx= hyperplastic polyp.  . COLONOSCOPY N/A 05/16/2015   Dr.Rourk- normal appearing rectal mucosa, scattered L sided diverticula, the remainder of the colonic mucosa appeared normal. Normal segmental biopsies   . CSF LEAF    . DILATION AND CURETTAGE OF UTERUS     X 3  . ESOPHAGOGASTRODUODENOSCOPY N/A 05/16/2015   Dr.Rourk- elliptical pyloric channel ulcer, gastric erosions, Hiatal hernia bx= benign mucosa  . ESOPHAGOGASTRODUODENOSCOPY N/A 08/09/2015   Dr. Gala Romney: previously noted gastric ulcer healed. small hiatal hernia   . HERNIA REPAIR    . left foot surgery    . PILONIDAL CYST REMOVED      OB History    No data available      Social History   Social History  . Marital status: Married    Spouse name: N/A  . Number of children: N/A  . Years of education: N/A   Social History Main Topics  . Smoking status: Former Smoker    Packs/day: 1.00    Years:  29.00    Types: Cigarettes  . Smokeless tobacco: Never Used     Comment: Quit x 10 years  . Alcohol use No  . Drug use: No  . Sexual activity: Not Currently   Other Topics Concern  . None   Social History Narrative  . None    Family History  Problem Relation Age of Onset  . Diabetes Mother   . Colon cancer Neg Hx      Current Outpatient Prescriptions:  .  acetaminophen (TYLENOL) 650 MG CR tablet, Take 1,300 mg by mouth every 8 (eight) hours as needed for pain., Disp: , Rfl:  .  albuterol (PROVENTIL HFA;VENTOLIN HFA) 108 (90 BASE) MCG/ACT inhaler, Inhale into the lungs every 6 (six) hours as needed for wheezing or shortness of breath., Disp: , Rfl:  .  amLODipine (NORVASC) 10 MG tablet, Take 5 mg by mouth daily. , Disp: , Rfl:  .  empagliflozin (JARDIANCE) 10 MG TABS tablet, Take 10 mg by mouth daily., Disp: , Rfl:  .  losartan (COZAAR) 100 MG tablet, Take 100 mg by mouth daily.  , Disp: , Rfl:  .  mometasone (NASONEX) 50 MCG/ACT nasal spray, Place 2 sprays into the nose daily as needed (congestion)., Disp: , Rfl:  .  montelukast (SINGULAIR) 10 MG  tablet, Take 10 mg by mouth daily. , Disp: , Rfl:  .  Multiple Vitamins-Minerals (MULTIVITAMINS THER. W/MINERALS) TABS, Take 1 tablet by mouth daily.  , Disp: , Rfl:  .  pantoprazole (PROTONIX) 40 MG tablet, Take 1 tablet (40 mg total) by mouth 2 (two) times daily before a meal., Disp: 60 tablet, Rfl: 3 .  dicyclomine (BENTYL) 10 MG capsule, Take 10 mg by mouth daily. , Disp: , Rfl:   Review of Systems  Review of Systems  Constitutional: Negative for fever, chills, weight loss, malaise/fatigue and diaphoresis.  HENT: Negative for hearing loss, ear pain, nosebleeds, congestion, sore throat, neck pain, tinnitus and ear discharge.   Eyes: Negative for blurred vision, double vision, photophobia, pain, discharge and redness.  Respiratory: Negative for cough, hemoptysis, sputum production, shortness of breath, wheezing and stridor.    Cardiovascular: Negative for chest pain, palpitations, orthopnea, claudication, leg swelling and PND.  Gastrointestinal: negative for abdominal pain. Negative for heartburn, nausea, vomiting, diarrhea, constipation, blood in stool and melena.  Genitourinary: Negative for dysuria, urgency, frequency, hematuria and flank pain.  Musculoskeletal: Negative for myalgias, back pain, joint pain and falls.  Skin: Negative for itching and rash.  Neurological: Negative for dizziness, tingling, tremors, sensory change, speech change, focal weakness, seizures, loss of consciousness, weakness and headaches.  Endo/Heme/Allergies: Negative for environmental allergies and polydipsia. Does not bruise/bleed easily.  Psychiatric/Behavioral: Negative for depression, suicidal ideas, hallucinations, memory loss and substance abuse. The patient is not nervous/anxious and does not have insomnia.        Objective:  Blood pressure 130/84, pulse 75, height 5\' 5"  (1.651 m), weight 209 lb (94.8 kg).   Physical Exam  Vitals reviewed. Constitutional: She is oriented to person, place, and time. She appears well-developed and well-nourished.  HENT:  Head: Normocephalic and atraumatic.        Right Ear: External ear normal.  Left Ear: External ear normal.  Nose: Nose normal.  Mouth/Throat: Oropharynx is clear and moist.  Eyes: Conjunctivae and EOM are normal. Pupils are equal, round, and reactive to light. Right eye exhibits no discharge. Left eye exhibits no discharge. No scleral icterus.  Neck: Normal range of motion. Neck supple. No tracheal deviation present. No thyromegaly present.  Cardiovascular: Normal rate, regular rhythm, normal heart sounds and intact distal pulses.  Exam reveals no gallop and no friction rub.   No murmur heard. Respiratory: Effort normal and breath sounds normal. No respiratory distress. She has no wheezes. She has no rales. She exhibits no tenderness.  GI: Soft. Bowel sounds are normal. She  exhibits no distension and no mass. There is no tenderness. There is no rebound and no guarding.  Genitourinary:  Breasts no masses skin changes or nipple changes bilaterally      Vulva is normal without lesions Vagina is pink moist without discharge Cervix absnet Uterus is absent Adnexa is negative   Musculoskeletal: Normal range of motion. She exhibits no edema and no tenderness.  Neurological: She is alert and oriented to person, place, and time. She has normal reflexes. She displays normal reflexes. No cranial nerve deficit. She exhibits normal muscle tone. Coordination normal.  Skin: Skin is warm and dry. No rash noted. No erythema. No pallor.  Psychiatric: She has a normal mood and affect. Her behavior is normal. Judgment and thought content normal.       Medications Ordered at today's visit: No orders of the defined types were placed in this encounter.   Other orders placed at today's  visit: No orders of the defined types were placed in this encounter.     Assessment:    Healthy female exam.    Plan:    Mammogram ordered. Follow up in: 2 years.     No Follow-up on file.

## 2016-12-28 DIAGNOSIS — Z08 Encounter for follow-up examination after completed treatment for malignant neoplasm: Secondary | ICD-10-CM | POA: Diagnosis not present

## 2016-12-28 DIAGNOSIS — X32XXXD Exposure to sunlight, subsequent encounter: Secondary | ICD-10-CM | POA: Diagnosis not present

## 2016-12-28 DIAGNOSIS — Z85828 Personal history of other malignant neoplasm of skin: Secondary | ICD-10-CM | POA: Diagnosis not present

## 2016-12-28 DIAGNOSIS — Z1283 Encounter for screening for malignant neoplasm of skin: Secondary | ICD-10-CM | POA: Diagnosis not present

## 2016-12-28 DIAGNOSIS — L820 Inflamed seborrheic keratosis: Secondary | ICD-10-CM | POA: Diagnosis not present

## 2016-12-28 DIAGNOSIS — L570 Actinic keratosis: Secondary | ICD-10-CM | POA: Diagnosis not present

## 2017-01-18 ENCOUNTER — Other Ambulatory Visit: Payer: Self-pay

## 2017-01-18 MED ORDER — PANTOPRAZOLE SODIUM 40 MG PO TBEC: 40.0000 mg | DELAYED_RELEASE_TABLET | Freq: Two times a day (BID) | ORAL | 3 refills | Status: DC

## 2017-01-22 NOTE — Telephone Encounter (Signed)
Pt was 3 month supply

## 2017-02-12 ENCOUNTER — Encounter: Payer: Self-pay | Admitting: Gastroenterology

## 2017-02-12 ENCOUNTER — Ambulatory Visit (INDEPENDENT_AMBULATORY_CARE_PROVIDER_SITE_OTHER): Payer: 59 | Admitting: Gastroenterology

## 2017-02-12 VITALS — BP 160/80 | HR 82 | Temp 96.8°F | Ht 65.0 in | Wt 198.2 lb

## 2017-02-12 DIAGNOSIS — K219 Gastro-esophageal reflux disease without esophagitis: Secondary | ICD-10-CM

## 2017-02-12 MED ORDER — PANTOPRAZOLE SODIUM 40 MG PO TBEC: 40.0000 mg | DELAYED_RELEASE_TABLET | Freq: Two times a day (BID) | ORAL | 3 refills | Status: DC

## 2017-02-12 NOTE — Progress Notes (Signed)
cc'ed to pcp °

## 2017-02-12 NOTE — Assessment & Plan Note (Signed)
61 year old female with history of chronic GERD, much improved with purposeful weight loss and dietary/behavior modification. Notes oropharyngeal liquid dysphagia, no solid food dysphagia or esophageal dysphagia. Will proceed with BPE first. May need Speech +/- ENT. Doubt EGD would be helpful unless BPE prompts further diagnostic evaluation. Continue Protonix BID and return in 3 months.

## 2017-02-12 NOTE — Patient Instructions (Signed)
Attempted to submit PA info for DG Esophagus via [facility] website, no PA needed.

## 2017-02-12 NOTE — Progress Notes (Signed)
Referring Provider: Sharilyn Sites, MD Primary Care Physician:  Sharilyn Sites, MD Primary GI: Dr. Gala Romney    Chief Complaint  Patient presents with  . Gastroesophageal Reflux  . Dysphagia    HPI:   Rebecca Burke is a 61 y.o. female presenting today with a history of GERD, PUD, diarrhea. Last colonoscopy in 2016 with normal appearing rectal mucosa, scattered left-sided diverticula, normal segmental biopsies. Last EGD in Dec 2016 with healed gastric ulcer, small hiatal hernia. Last seen March 2018 with worsening GERD symptoms in the setting of weight gain. Noted a globus sensation but without outright esophageal dysphagia. Prescribed Protonix BID and asked to call in 2 weeks with update with plans to pursue EGD +/- dilation. She has lost 17 lbs since seen in March 2018 purposefully by cutting calories.   GERD is better with weight loss. In last few weeks, she has gotten strangled on water or spit. Only strangled with liquids. No solid food dysphagia. Will get ready to swallow coffee and has to hold it for a minute. Protonix BID. Has a lot of sinus drainage. No pill dysphagia. Seems to have more oropharyngeal dysphagia.   Past Medical History:  Diagnosis Date  . Diabetes mellitus   . Diverticulosis   . Endometriosis   . Gastric ulcer   . GERD (gastroesophageal reflux disease)   . Hyperplastic colon polyp 2012  . Hypertension   . Meningitis     Past Surgical History:  Procedure Laterality Date  . ABDOMINAL HYSTERECTOMY    . BACK SURGERY    . BRAIN SURGERY     Cerebro spinal fluid leak  . CARPAL TUNNEL RELEASE    . CESAREAN SECTION     X 2  . CHOLECYSTECTOMY    . COLONOSCOPY  08/28/2011   Dr.Rourk- suboptimal prep. I- 61mm oval polyp at the splenic flexure. remainder of colonic and rectal mucosa appeared normal. bx= hyperplastic polyp.  . COLONOSCOPY N/A 05/16/2015   Dr.Rourk- normal appearing rectal mucosa, scattered L sided diverticula, the remainder of the colonic mucosa  appeared normal. Normal segmental biopsies   . CSF LEAF    . DILATION AND CURETTAGE OF UTERUS     X 3  . ESOPHAGOGASTRODUODENOSCOPY N/A 05/16/2015   Dr.Rourk- elliptical pyloric channel ulcer, gastric erosions, Hiatal hernia bx= benign mucosa  . ESOPHAGOGASTRODUODENOSCOPY N/A 08/09/2015   Dr. Gala Romney: previously noted gastric ulcer healed. small hiatal hernia   . HERNIA REPAIR    . left foot surgery    . PILONIDAL CYST REMOVED      Current Outpatient Prescriptions  Medication Sig Dispense Refill  . acetaminophen (TYLENOL) 650 MG CR tablet Take 1,300 mg by mouth every 8 (eight) hours as needed for pain.    Marland Kitchen albuterol (PROVENTIL HFA;VENTOLIN HFA) 108 (90 BASE) MCG/ACT inhaler Inhale into the lungs every 6 (six) hours as needed for wheezing or shortness of breath.    Marland Kitchen amLODipine (NORVASC) 10 MG tablet Take 5 mg by mouth daily.     . empagliflozin (JARDIANCE) 10 MG TABS tablet Take 10 mg by mouth daily.    Marland Kitchen losartan (COZAAR) 100 MG tablet Take 100 mg by mouth daily.      . mometasone (NASONEX) 50 MCG/ACT nasal spray Place 2 sprays into the nose daily as needed (congestion).    . montelukast (SINGULAIR) 10 MG tablet Take 10 mg by mouth daily.     . Multiple Vitamins-Minerals (MULTIVITAMINS THER. W/MINERALS) TABS Take 1 tablet by mouth daily.      Marland Kitchen  pantoprazole (PROTONIX) 40 MG tablet Take 1 tablet (40 mg total) by mouth 2 (two) times daily before a meal. 60 tablet 3  . dicyclomine (BENTYL) 10 MG capsule Take 10 mg by mouth daily.      No current facility-administered medications for this visit.     Allergies as of 02/12/2017 - Review Complete 02/12/2017  Allergen Reaction Noted  . Other Anaphylaxis and Swelling 08/18/2011  . Codeine Other (See Comments) 08/05/2011  . Hydrocodone-acetaminophen Nausea Only 02/16/2013  . Sulfa antibiotics Other (See Comments) 08/05/2011  . Tetracyclines & related  10/29/2013    Family History  Problem Relation Age of Onset  . Diabetes Mother   .  Colon cancer Neg Hx     Social History   Social History  . Marital status: Married    Spouse name: N/A  . Number of children: N/A  . Years of education: N/A   Social History Main Topics  . Smoking status: Former Smoker    Packs/day: 1.00    Years: 29.00    Types: Cigarettes  . Smokeless tobacco: Never Used     Comment: Quit x 10 years  . Alcohol use No  . Drug use: No  . Sexual activity: Not Currently   Other Topics Concern  . None   Social History Narrative  . None    Review of Systems: As mentioned in HPI   Physical Exam: BP (!) 160/80   Pulse 82   Temp (!) 96.8 F (36 C) (Oral)   Ht 5\' 5"  (1.651 m)   Wt 198 lb 3.2 oz (89.9 kg)   BMI 32.98 kg/m  General:   Alert and oriented. No distress noted. Pleasant and cooperative.  Head:  Normocephalic and atraumatic. Eyes:  Conjuctiva clear without scleral icterus. Mouth:  Oral mucosa pink and moist. Good dentition. No lesions. Heart:  S1, S2 present without murmurs, rubs, or gallops. Regular rate and rhythm. Abdomen:  +BS, soft, non-tender and non-distended. No rebound or guarding. No HSM or masses noted. Msk:  Symmetrical without gross deformities. Normal posture. Extremities:  Without edema. Neurologic:  Alert and  oriented x4;  grossly normal neurologically. Psych:  Alert and cooperative. Normal mood and affect.

## 2017-02-12 NOTE — Patient Instructions (Signed)
We have set you up for a special xray of your esophagus.  We will see you in 3 months!

## 2017-02-19 ENCOUNTER — Encounter (INDEPENDENT_AMBULATORY_CARE_PROVIDER_SITE_OTHER): Payer: Self-pay | Admitting: Ophthalmology

## 2017-02-26 ENCOUNTER — Encounter (INDEPENDENT_AMBULATORY_CARE_PROVIDER_SITE_OTHER): Payer: Self-pay | Admitting: Ophthalmology

## 2017-03-19 ENCOUNTER — Encounter (INDEPENDENT_AMBULATORY_CARE_PROVIDER_SITE_OTHER): Payer: 59 | Admitting: Ophthalmology

## 2017-03-19 DIAGNOSIS — E113293 Type 2 diabetes mellitus with mild nonproliferative diabetic retinopathy without macular edema, bilateral: Secondary | ICD-10-CM

## 2017-03-19 DIAGNOSIS — H35033 Hypertensive retinopathy, bilateral: Secondary | ICD-10-CM | POA: Diagnosis not present

## 2017-03-19 DIAGNOSIS — E11319 Type 2 diabetes mellitus with unspecified diabetic retinopathy without macular edema: Secondary | ICD-10-CM | POA: Diagnosis not present

## 2017-03-19 DIAGNOSIS — I10 Essential (primary) hypertension: Secondary | ICD-10-CM

## 2017-03-19 DIAGNOSIS — H43813 Vitreous degeneration, bilateral: Secondary | ICD-10-CM | POA: Diagnosis not present

## 2017-03-26 ENCOUNTER — Other Ambulatory Visit (HOSPITAL_COMMUNITY): Payer: Self-pay

## 2017-04-16 DIAGNOSIS — M1990 Unspecified osteoarthritis, unspecified site: Secondary | ICD-10-CM | POA: Diagnosis not present

## 2017-04-16 DIAGNOSIS — Z1389 Encounter for screening for other disorder: Secondary | ICD-10-CM | POA: Diagnosis not present

## 2017-04-16 DIAGNOSIS — E1165 Type 2 diabetes mellitus with hyperglycemia: Secondary | ICD-10-CM | POA: Diagnosis not present

## 2017-04-16 DIAGNOSIS — J45909 Unspecified asthma, uncomplicated: Secondary | ICD-10-CM | POA: Diagnosis not present

## 2017-05-14 ENCOUNTER — Ambulatory Visit: Payer: 59 | Admitting: Gastroenterology

## 2017-05-28 ENCOUNTER — Other Ambulatory Visit (HOSPITAL_COMMUNITY): Payer: Self-pay | Admitting: Family Medicine

## 2017-05-28 DIAGNOSIS — Z1231 Encounter for screening mammogram for malignant neoplasm of breast: Secondary | ICD-10-CM

## 2017-06-04 ENCOUNTER — Ambulatory Visit (HOSPITAL_COMMUNITY)
Admission: RE | Admit: 2017-06-04 | Discharge: 2017-06-04 | Disposition: A | Discharge status: Home / Self Care | Payer: 59 | Source: Ambulatory Visit | Attending: Family Medicine | Admitting: Family Medicine

## 2017-06-04 DIAGNOSIS — Z1231 Encounter for screening mammogram for malignant neoplasm of breast: Secondary | ICD-10-CM

## 2017-06-14 DIAGNOSIS — M5126 Other intervertebral disc displacement, lumbar region: Secondary | ICD-10-CM | POA: Diagnosis not present

## 2017-06-14 DIAGNOSIS — M545 Low back pain: Secondary | ICD-10-CM | POA: Diagnosis not present

## 2017-06-18 DIAGNOSIS — M545 Low back pain: Secondary | ICD-10-CM | POA: Diagnosis not present

## 2017-06-18 DIAGNOSIS — M5126 Other intervertebral disc displacement, lumbar region: Secondary | ICD-10-CM | POA: Diagnosis not present

## 2017-06-18 DIAGNOSIS — M5410 Radiculopathy, site unspecified: Secondary | ICD-10-CM | POA: Diagnosis not present

## 2017-07-16 ENCOUNTER — Other Ambulatory Visit: Payer: Self-pay | Admitting: Gastroenterology

## 2017-08-30 DIAGNOSIS — B029 Zoster without complications: Secondary | ICD-10-CM | POA: Diagnosis not present

## 2017-09-20 DIAGNOSIS — B029 Zoster without complications: Secondary | ICD-10-CM | POA: Diagnosis not present

## 2017-09-24 DIAGNOSIS — E119 Type 2 diabetes mellitus without complications: Secondary | ICD-10-CM | POA: Diagnosis not present

## 2017-09-24 DIAGNOSIS — E785 Hyperlipidemia, unspecified: Secondary | ICD-10-CM | POA: Diagnosis not present

## 2017-09-24 DIAGNOSIS — I10 Essential (primary) hypertension: Secondary | ICD-10-CM | POA: Diagnosis not present

## 2017-10-08 ENCOUNTER — Other Ambulatory Visit (HOSPITAL_COMMUNITY): Payer: Self-pay | Admitting: Internal Medicine

## 2017-10-08 DIAGNOSIS — Z0001 Encounter for general adult medical examination with abnormal findings: Secondary | ICD-10-CM | POA: Diagnosis not present

## 2017-10-08 DIAGNOSIS — Z780 Asymptomatic menopausal state: Secondary | ICD-10-CM

## 2017-10-22 ENCOUNTER — Other Ambulatory Visit (HOSPITAL_COMMUNITY): Payer: Self-pay

## 2017-10-22 ENCOUNTER — Encounter (HOSPITAL_COMMUNITY): Payer: Self-pay

## 2017-11-05 ENCOUNTER — Other Ambulatory Visit: Payer: Self-pay | Admitting: Gastroenterology

## 2017-11-19 ENCOUNTER — Other Ambulatory Visit (HOSPITAL_COMMUNITY): Payer: Self-pay

## 2017-12-06 ENCOUNTER — Other Ambulatory Visit (HOSPITAL_COMMUNITY): Payer: Self-pay

## 2017-12-10 ENCOUNTER — Ambulatory Visit (HOSPITAL_COMMUNITY)
Admission: RE | Admit: 2017-12-10 | Discharge: 2017-12-10 | Disposition: A | Discharge status: Home / Self Care | Payer: 59 | Source: Ambulatory Visit | Attending: Internal Medicine | Admitting: Internal Medicine

## 2017-12-10 DIAGNOSIS — Z780 Asymptomatic menopausal state: Secondary | ICD-10-CM | POA: Insufficient documentation

## 2017-12-10 DIAGNOSIS — M85852 Other specified disorders of bone density and structure, left thigh: Secondary | ICD-10-CM | POA: Insufficient documentation

## 2017-12-10 DIAGNOSIS — Z13820 Encounter for screening for osteoporosis: Secondary | ICD-10-CM | POA: Insufficient documentation

## 2017-12-31 DIAGNOSIS — E785 Hyperlipidemia, unspecified: Secondary | ICD-10-CM | POA: Diagnosis not present

## 2017-12-31 DIAGNOSIS — E119 Type 2 diabetes mellitus without complications: Secondary | ICD-10-CM | POA: Diagnosis not present

## 2017-12-31 DIAGNOSIS — Z0001 Encounter for general adult medical examination with abnormal findings: Secondary | ICD-10-CM | POA: Diagnosis not present

## 2017-12-31 DIAGNOSIS — I10 Essential (primary) hypertension: Secondary | ICD-10-CM | POA: Diagnosis not present

## 2018-01-07 DIAGNOSIS — I10 Essential (primary) hypertension: Secondary | ICD-10-CM | POA: Diagnosis not present

## 2018-01-07 DIAGNOSIS — E119 Type 2 diabetes mellitus without complications: Secondary | ICD-10-CM | POA: Diagnosis not present

## 2018-01-07 DIAGNOSIS — K219 Gastro-esophageal reflux disease without esophagitis: Secondary | ICD-10-CM | POA: Diagnosis not present

## 2018-01-29 DIAGNOSIS — J302 Other seasonal allergic rhinitis: Secondary | ICD-10-CM | POA: Diagnosis not present

## 2018-01-29 DIAGNOSIS — J060 Acute laryngopharyngitis: Secondary | ICD-10-CM | POA: Diagnosis not present

## 2018-02-28 DIAGNOSIS — E119 Type 2 diabetes mellitus without complications: Secondary | ICD-10-CM | POA: Diagnosis not present

## 2018-02-28 DIAGNOSIS — I10 Essential (primary) hypertension: Secondary | ICD-10-CM | POA: Diagnosis not present

## 2018-03-25 ENCOUNTER — Encounter (INDEPENDENT_AMBULATORY_CARE_PROVIDER_SITE_OTHER): Payer: 59 | Admitting: Ophthalmology

## 2018-04-15 ENCOUNTER — Encounter (INDEPENDENT_AMBULATORY_CARE_PROVIDER_SITE_OTHER): Payer: 59 | Admitting: Ophthalmology

## 2018-04-15 DIAGNOSIS — H2513 Age-related nuclear cataract, bilateral: Secondary | ICD-10-CM

## 2018-04-15 DIAGNOSIS — H43813 Vitreous degeneration, bilateral: Secondary | ICD-10-CM

## 2018-04-15 DIAGNOSIS — E11319 Type 2 diabetes mellitus with unspecified diabetic retinopathy without macular edema: Secondary | ICD-10-CM

## 2018-04-15 DIAGNOSIS — H35033 Hypertensive retinopathy, bilateral: Secondary | ICD-10-CM | POA: Diagnosis not present

## 2018-04-15 DIAGNOSIS — E113293 Type 2 diabetes mellitus with mild nonproliferative diabetic retinopathy without macular edema, bilateral: Secondary | ICD-10-CM

## 2018-04-15 DIAGNOSIS — I10 Essential (primary) hypertension: Secondary | ICD-10-CM | POA: Diagnosis not present

## 2018-05-06 ENCOUNTER — Other Ambulatory Visit (HOSPITAL_COMMUNITY): Payer: Self-pay | Admitting: Internal Medicine

## 2018-05-06 DIAGNOSIS — Z1231 Encounter for screening mammogram for malignant neoplasm of breast: Secondary | ICD-10-CM

## 2018-05-13 DIAGNOSIS — M25522 Pain in left elbow: Secondary | ICD-10-CM | POA: Diagnosis not present

## 2018-05-13 DIAGNOSIS — E119 Type 2 diabetes mellitus without complications: Secondary | ICD-10-CM | POA: Diagnosis not present

## 2018-05-13 DIAGNOSIS — E785 Hyperlipidemia, unspecified: Secondary | ICD-10-CM | POA: Diagnosis not present

## 2018-05-13 DIAGNOSIS — I10 Essential (primary) hypertension: Secondary | ICD-10-CM | POA: Diagnosis not present

## 2018-05-13 DIAGNOSIS — Z0001 Encounter for general adult medical examination with abnormal findings: Secondary | ICD-10-CM | POA: Diagnosis not present

## 2018-05-13 DIAGNOSIS — E782 Mixed hyperlipidemia: Secondary | ICD-10-CM | POA: Diagnosis not present

## 2018-05-20 ENCOUNTER — Other Ambulatory Visit: Payer: Self-pay | Admitting: Gastroenterology

## 2018-05-27 DIAGNOSIS — E785 Hyperlipidemia, unspecified: Secondary | ICD-10-CM | POA: Diagnosis not present

## 2018-05-27 DIAGNOSIS — K219 Gastro-esophageal reflux disease without esophagitis: Secondary | ICD-10-CM | POA: Diagnosis not present

## 2018-05-27 DIAGNOSIS — M25522 Pain in left elbow: Secondary | ICD-10-CM | POA: Diagnosis not present

## 2018-05-27 DIAGNOSIS — E1169 Type 2 diabetes mellitus with other specified complication: Secondary | ICD-10-CM | POA: Diagnosis not present

## 2018-05-27 DIAGNOSIS — E782 Mixed hyperlipidemia: Secondary | ICD-10-CM | POA: Diagnosis not present

## 2018-05-27 DIAGNOSIS — I10 Essential (primary) hypertension: Secondary | ICD-10-CM | POA: Diagnosis not present

## 2018-06-02 DIAGNOSIS — D225 Melanocytic nevi of trunk: Secondary | ICD-10-CM | POA: Diagnosis not present

## 2018-06-02 DIAGNOSIS — Z1283 Encounter for screening for malignant neoplasm of skin: Secondary | ICD-10-CM | POA: Diagnosis not present

## 2018-06-02 DIAGNOSIS — L570 Actinic keratosis: Secondary | ICD-10-CM | POA: Diagnosis not present

## 2018-06-02 DIAGNOSIS — X32XXXD Exposure to sunlight, subsequent encounter: Secondary | ICD-10-CM | POA: Diagnosis not present

## 2018-06-02 DIAGNOSIS — M792 Neuralgia and neuritis, unspecified: Secondary | ICD-10-CM | POA: Diagnosis not present

## 2018-06-07 ENCOUNTER — Encounter: Payer: Self-pay | Admitting: Adult Health

## 2018-06-07 ENCOUNTER — Ambulatory Visit: Payer: 59 | Admitting: Adult Health

## 2018-06-07 ENCOUNTER — Other Ambulatory Visit: Payer: Self-pay

## 2018-06-07 VITALS — BP 130/80 | HR 80 | Ht 65.0 in | Wt 206.0 lb

## 2018-06-07 DIAGNOSIS — Z1212 Encounter for screening for malignant neoplasm of rectum: Secondary | ICD-10-CM

## 2018-06-07 DIAGNOSIS — Z01419 Encounter for gynecological examination (general) (routine) without abnormal findings: Secondary | ICD-10-CM | POA: Diagnosis not present

## 2018-06-07 DIAGNOSIS — Z1211 Encounter for screening for malignant neoplasm of colon: Secondary | ICD-10-CM | POA: Diagnosis not present

## 2018-06-07 LAB — HEMOCCULT GUIAC POC 1CARD (OFFICE): FECAL OCCULT BLD: NEGATIVE

## 2018-06-07 NOTE — Progress Notes (Signed)
Patient ID: Rebecca Burke, female   DOB: 10/30/1955, 62 y.o.   MRN: [id number] History of Present Illness: Rebecca Burke is a 62 year old white female, married, sp hysterectomy in for a well woman gyn exam.Still working at [employer].  PCP is Dr Nevada Crane.   Current Medications, Allergies, Past Medical History, Past Surgical History, Family History and Social History were reviewed in [employer] record.     Review of Systems: Patient denies any headaches, hearing loss, fatigue, blurred vision, shortness of breath, chest pain, abdominal pain, problems with bowel movements, urination, or intercourse(not having sex). No joint pain or mood swings.    Physical Exam:BP 130/80 (BP Location: Left Arm, Patient Position: Sitting, Cuff Size: Normal)   Pulse 80   Ht 5\' 5"  (1.651 m)   Wt 206 lb (93.4 kg)   BMI 34.28 kg/m  General:  Well developed, well nourished, no acute distress Skin:  Warm and dry Neck:  Midline trachea, normal thyroid, good ROM, no lymphadenopathy Lungs; Clear to auscultation bilaterally Breast:  No dominant palpable mass, retraction, or nipple discharge Cardiovascular: Regular rate and rhythm Abdomen:  Soft, non tender, no hepatosplenomegaly Pelvic:  External genitalia is normal in appearance, no lesions.  The vagina is pale with loss of moisture and rugae. Urethra has no lesions or masses. The cervix and uterus are absent.  No adnexal masses or tenderness noted.Bladder is non tender, no masses felt. Rectal: Good sphincter tone, no polyps, or hemorrhoids felt.  Hemoccult negative. Extremities/musculoskeletal:  No swelling or varicosities noted, no clubbing or cyanosis Psych:  No mood changes, alert and cooperative,seems happy PHQ 2 score 0. Examination chaperoned by Estill Bamberg Rash LPN.  Impression: 1. Well woman exam with routine gynecological exam   2. Screening for colorectal cancer       Plan: Physical in 1 year Labs with PCP Colonoscopy per  GI Mammogram tomorrow and yearly  She had flu shot 05/31/18 at work

## 2018-06-08 ENCOUNTER — Ambulatory Visit (HOSPITAL_COMMUNITY)
Admission: RE | Admit: 2018-06-08 | Discharge: 2018-06-08 | Disposition: A | Discharge status: Home / Self Care | Payer: 59 | Source: Ambulatory Visit | Attending: Internal Medicine | Admitting: Internal Medicine

## 2018-06-08 DIAGNOSIS — Z1231 Encounter for screening mammogram for malignant neoplasm of breast: Secondary | ICD-10-CM

## 2018-08-13 DIAGNOSIS — J018 Other acute sinusitis: Secondary | ICD-10-CM | POA: Diagnosis not present

## 2018-08-13 DIAGNOSIS — J302 Other seasonal allergic rhinitis: Secondary | ICD-10-CM | POA: Diagnosis not present

## 2018-08-13 DIAGNOSIS — I10 Essential (primary) hypertension: Secondary | ICD-10-CM | POA: Diagnosis not present

## 2018-09-17 ENCOUNTER — Other Ambulatory Visit: Payer: Self-pay | Admitting: Gastroenterology

## 2018-12-22 ENCOUNTER — Other Ambulatory Visit: Payer: Self-pay | Admitting: Gastroenterology

## 2019-02-05 IMAGING — MG DIGITAL SCREENING BILATERAL MAMMOGRAM WITH CAD
4 series · 4 of 4 positions shown · non-contrast
Comparison: Previous exam(s).

CLINICAL DATA: Screening.

EXAM:
DIGITAL SCREENING BILATERAL MAMMOGRAM WITH CAD

[L MLO]
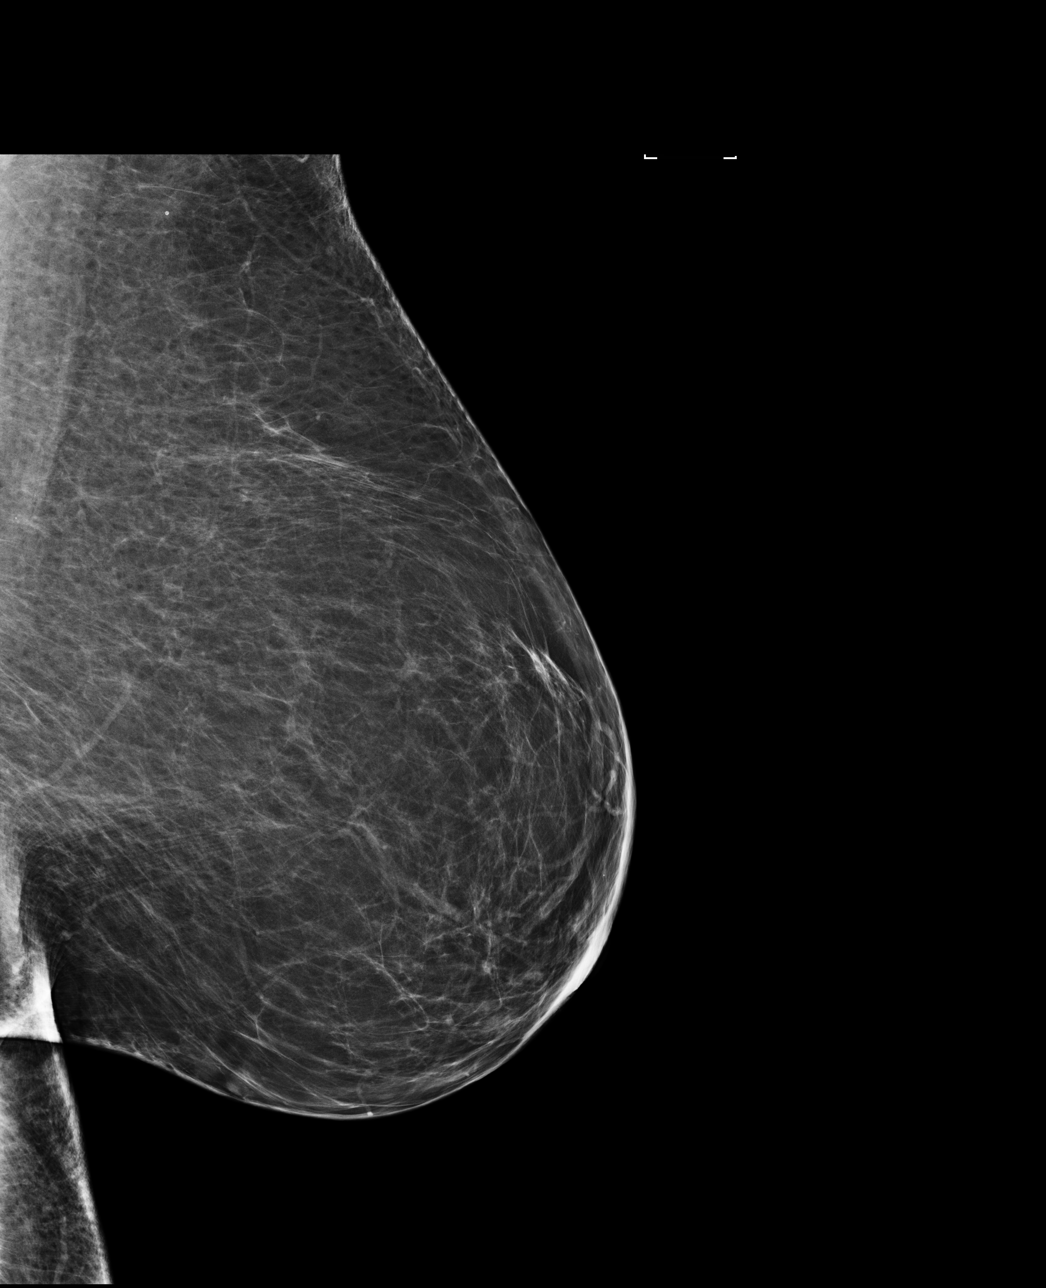

[R CC]
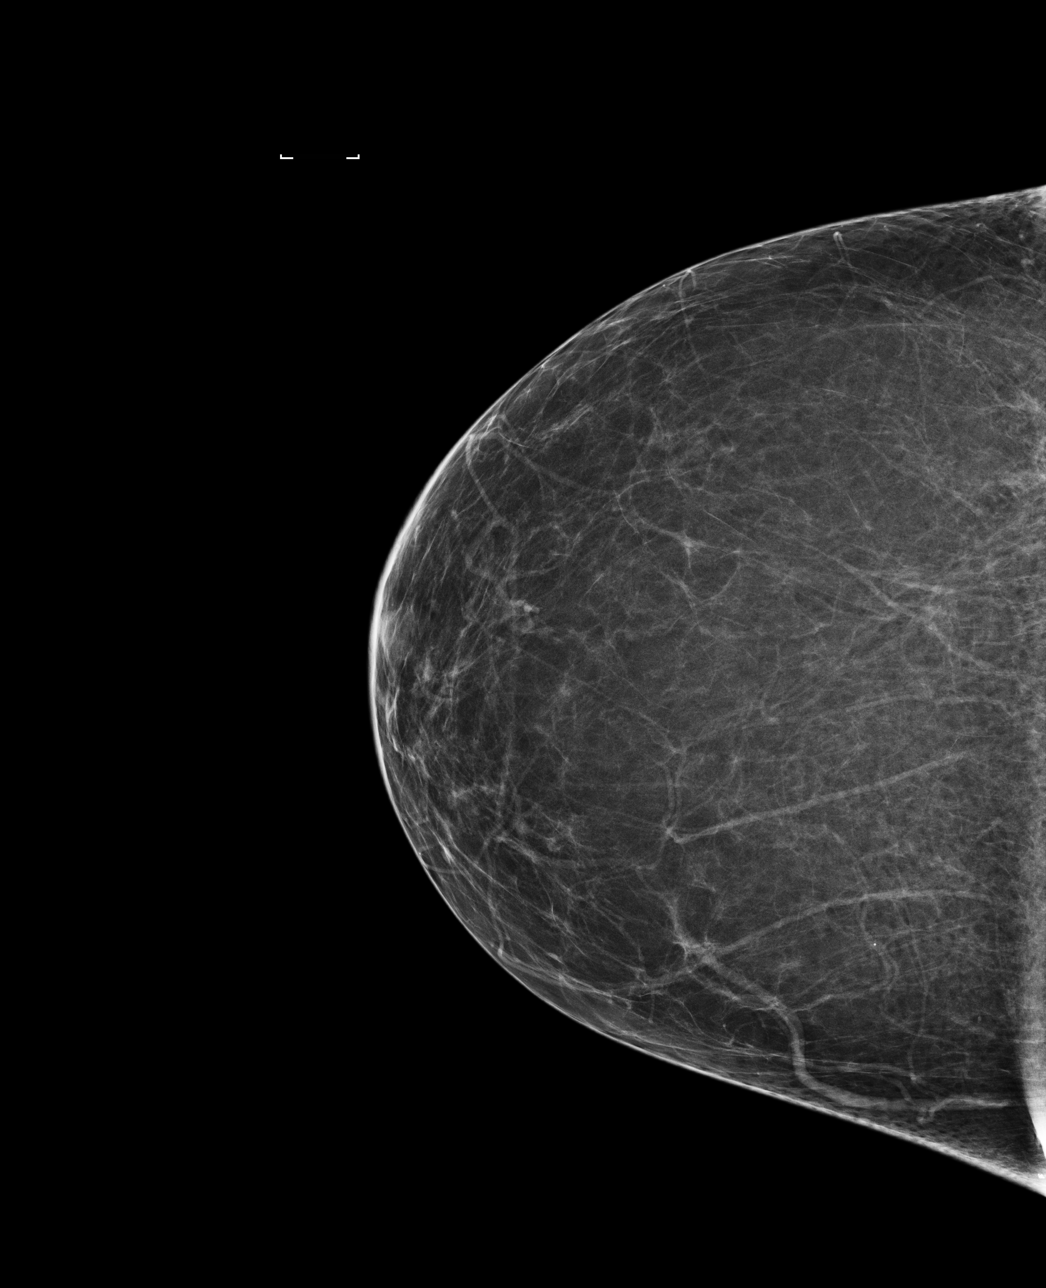

[R MLO]
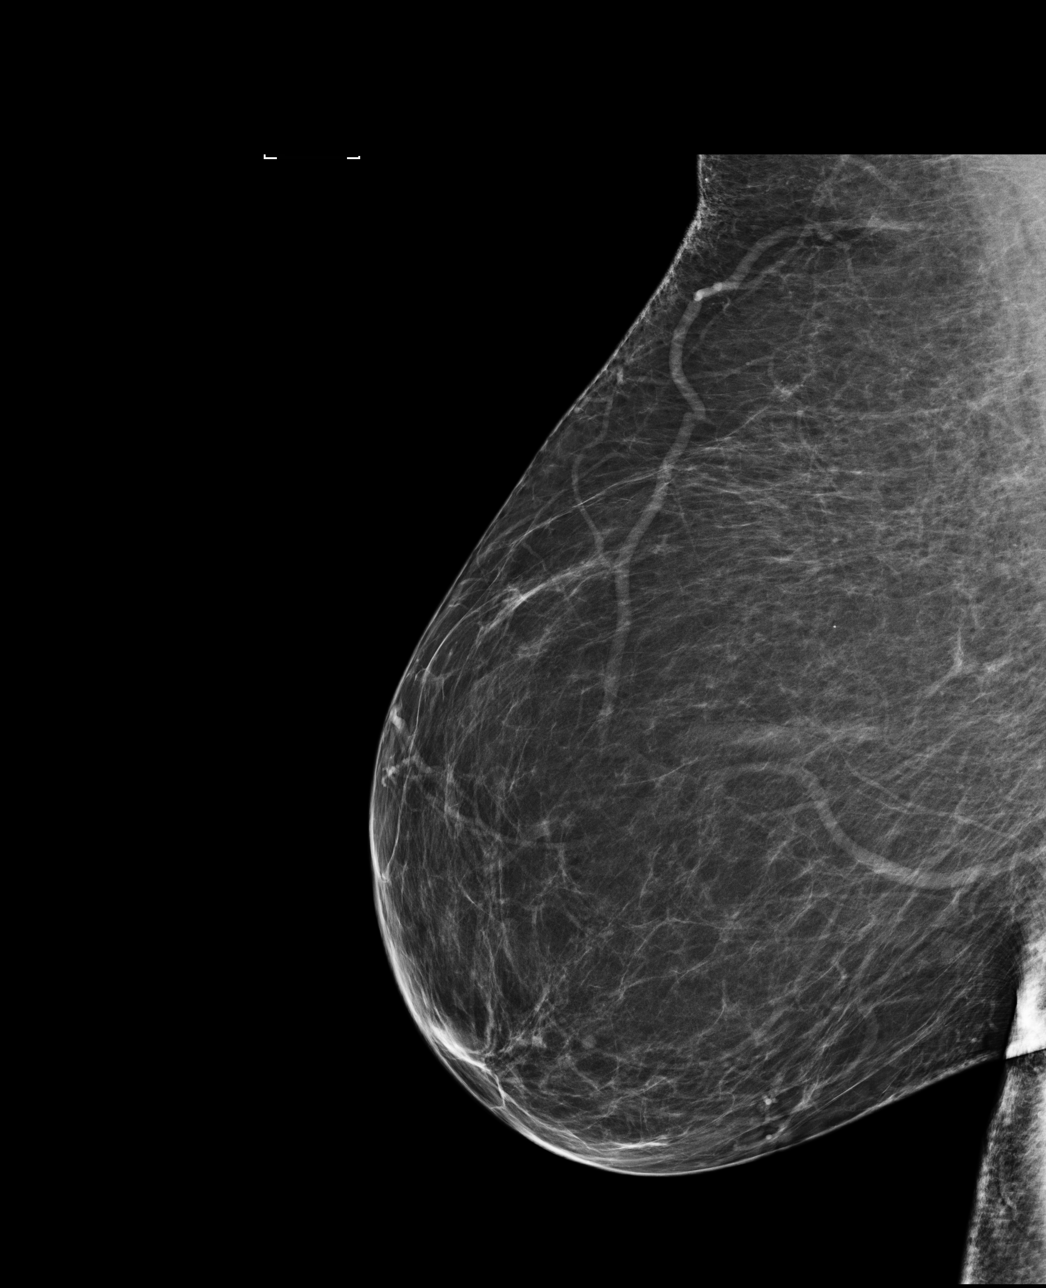

[L CC]
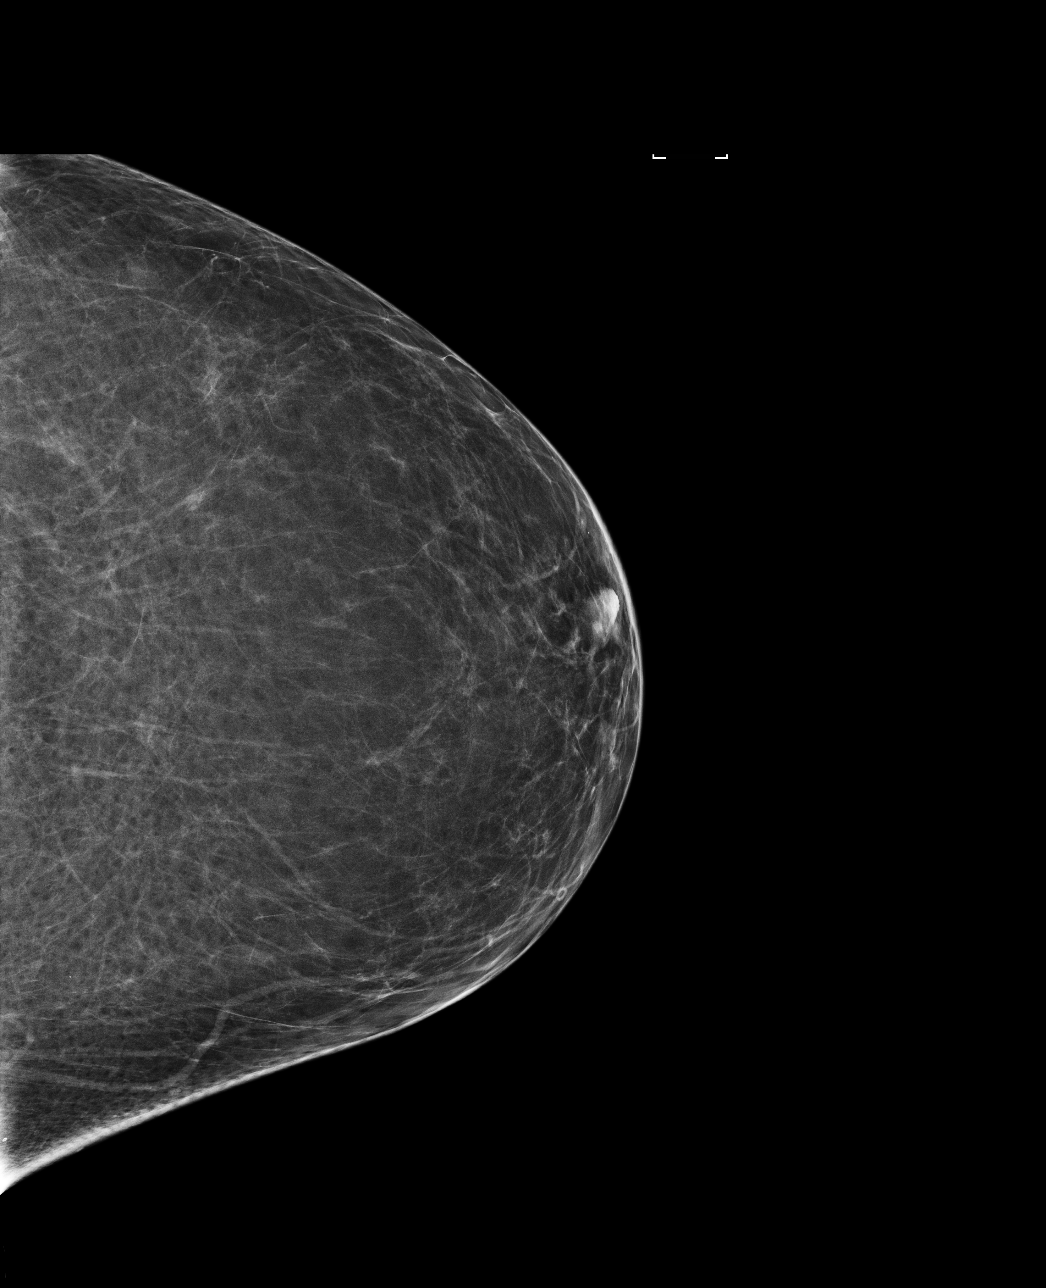

[4 of 4 positions shown; findings below may reference images not displayed]

ACR Breast Density Category b: There are scattered areas of
fibroglandular density.
FINDINGS: There are no findings suspicious for malignancy. Images were
processed with CAD.
IMPRESSION: No mammographic evidence of malignancy. A result letter of this
screening mammogram will be mailed directly to the patient.

RECOMMENDATION:
Screening mammogram in one year. (Code:AS-G-LCT)

BI-RADS CATEGORY  1: Negative.

## 2019-04-18 ENCOUNTER — Encounter (INDEPENDENT_AMBULATORY_CARE_PROVIDER_SITE_OTHER): Payer: 59 | Admitting: Ophthalmology

## 2019-04-18 ENCOUNTER — Other Ambulatory Visit: Payer: Self-pay

## 2019-04-18 DIAGNOSIS — H43813 Vitreous degeneration, bilateral: Secondary | ICD-10-CM | POA: Diagnosis not present

## 2019-04-18 DIAGNOSIS — I10 Essential (primary) hypertension: Secondary | ICD-10-CM

## 2019-04-18 DIAGNOSIS — H35033 Hypertensive retinopathy, bilateral: Secondary | ICD-10-CM

## 2019-04-18 DIAGNOSIS — H2513 Age-related nuclear cataract, bilateral: Secondary | ICD-10-CM | POA: Diagnosis not present

## 2019-05-09 ENCOUNTER — Other Ambulatory Visit (HOSPITAL_COMMUNITY): Payer: Self-pay | Admitting: Adult Health Nurse Practitioner

## 2019-05-09 DIAGNOSIS — Z1231 Encounter for screening mammogram for malignant neoplasm of breast: Secondary | ICD-10-CM

## 2019-06-19 ENCOUNTER — Ambulatory Visit (HOSPITAL_COMMUNITY): Payer: 59

## 2019-06-20 ENCOUNTER — Other Ambulatory Visit: Payer: Self-pay | Admitting: Gastroenterology

## 2019-06-21 ENCOUNTER — Ambulatory Visit (HOSPITAL_COMMUNITY)
Admission: RE | Admit: 2019-06-21 | Discharge: 2019-06-21 | Disposition: A | Discharge status: Home / Self Care | Payer: BC Managed Care – PPO | Source: Ambulatory Visit | Attending: Adult Health Nurse Practitioner | Admitting: Adult Health Nurse Practitioner

## 2019-06-21 ENCOUNTER — Other Ambulatory Visit: Payer: Self-pay

## 2019-06-21 DIAGNOSIS — Z1231 Encounter for screening mammogram for malignant neoplasm of breast: Secondary | ICD-10-CM | POA: Diagnosis not present

## 2019-11-20 ENCOUNTER — Other Ambulatory Visit: Payer: Self-pay | Admitting: Gastroenterology

## 2019-11-22 NOTE — Telephone Encounter (Signed)
Please tell the patient I can send in a limited supply but she'll need a f/u OV due to no visit in 2-3 years

## 2020-04-17 ENCOUNTER — Other Ambulatory Visit: Payer: Self-pay

## 2020-04-17 ENCOUNTER — Encounter (INDEPENDENT_AMBULATORY_CARE_PROVIDER_SITE_OTHER): Payer: BC Managed Care – PPO | Admitting: Ophthalmology

## 2020-04-17 DIAGNOSIS — I10 Essential (primary) hypertension: Secondary | ICD-10-CM

## 2020-04-17 DIAGNOSIS — H35033 Hypertensive retinopathy, bilateral: Secondary | ICD-10-CM

## 2020-04-17 DIAGNOSIS — H43813 Vitreous degeneration, bilateral: Secondary | ICD-10-CM | POA: Diagnosis not present

## 2020-06-07 ENCOUNTER — Other Ambulatory Visit (HOSPITAL_COMMUNITY): Payer: Self-pay | Admitting: Internal Medicine

## 2020-06-07 DIAGNOSIS — Z1231 Encounter for screening mammogram for malignant neoplasm of breast: Secondary | ICD-10-CM

## 2020-06-17 ENCOUNTER — Other Ambulatory Visit: Payer: Self-pay | Admitting: Nurse Practitioner

## 2020-06-24 ENCOUNTER — Ambulatory Visit (HOSPITAL_COMMUNITY)
Admission: RE | Admit: 2020-06-24 | Discharge: 2020-06-24 | Disposition: A | Discharge status: Home / Self Care | Payer: BC Managed Care – PPO | Source: Ambulatory Visit | Attending: Internal Medicine | Admitting: Internal Medicine

## 2020-06-24 ENCOUNTER — Other Ambulatory Visit: Payer: Self-pay

## 2020-06-24 DIAGNOSIS — Z1231 Encounter for screening mammogram for malignant neoplasm of breast: Secondary | ICD-10-CM | POA: Insufficient documentation

## 2020-06-25 ENCOUNTER — Telehealth: Payer: Self-pay

## 2020-06-25 NOTE — Telephone Encounter (Signed)
Pt would like to get a 90 day supply of Pantoprazole 40 mg bid sent to her pharmacy. The price is less expense for pt.

## 2020-06-26 NOTE — Telephone Encounter (Signed)
Please let pt know we cannot refill her medication because we have not seen her since 2018. She can get from PCP or have OV here.

## 2020-06-27 NOTE — Telephone Encounter (Signed)
Spoke with pt. Pt is aware and will discuss with her PCP.

## 2020-11-06 ENCOUNTER — Ambulatory Visit (HOSPITAL_COMMUNITY)
Admission: RE | Admit: 2020-11-06 | Discharge: 2020-11-06 | Disposition: A | Discharge status: Home / Self Care | Payer: BC Managed Care – PPO | Source: Ambulatory Visit | Attending: Family Medicine | Admitting: Family Medicine

## 2020-11-06 ENCOUNTER — Other Ambulatory Visit (HOSPITAL_COMMUNITY): Payer: Self-pay | Admitting: Family Medicine

## 2020-11-06 DIAGNOSIS — M533 Sacrococcygeal disorders, not elsewhere classified: Secondary | ICD-10-CM

## 2020-12-11 ENCOUNTER — Other Ambulatory Visit (HOSPITAL_COMMUNITY): Payer: Self-pay | Admitting: Internal Medicine

## 2020-12-11 DIAGNOSIS — Z13820 Encounter for screening for osteoporosis: Secondary | ICD-10-CM

## 2021-04-14 ENCOUNTER — Ambulatory Visit: Payer: BC Managed Care – PPO | Admitting: Obstetrics & Gynecology

## 2021-04-14 ENCOUNTER — Encounter: Payer: Self-pay | Admitting: Obstetrics & Gynecology

## 2021-04-14 ENCOUNTER — Other Ambulatory Visit: Payer: Self-pay

## 2021-04-14 VITALS — BP 127/83 | HR 90 | Ht 65.0 in | Wt 191.2 lb

## 2021-04-14 DIAGNOSIS — N750 Cyst of Bartholin's gland: Secondary | ICD-10-CM | POA: Diagnosis not present

## 2021-04-14 NOTE — Progress Notes (Addendum)
   GYN VISIT Patient name: Rebecca Burke MRN [id number]  Date of birth: 1956/01/05 Chief Complaint:   ? cyst on labia  History of Present Illness:   Rebecca Burke is a 65 y.o. [id number] PM, [place] female being seen today for the following concern:   Vulvar cyst: About 4 days ago noted a bump and some soreness.  No itching, drainage, feels "hardness" under the skin.  Taken some ibuprofen, but minimal improvement. No bleeding, odor or pelvic pain.     No LMP recorded. Patient has had a hysterectomy.  Depression screen PHQ 2/9 06/07/2018  Decreased Interest 0  Down, Depressed, Hopeless 0  PHQ - 2 Score 0     Review of Systems:   Pertinent items are noted in HPI Denies fever/chills, dizziness, headaches, visual disturbances, fatigue, shortness of breath, chest pain, abdominal pain, vomiting, bowel movements, urination, or intercourse unless otherwise stated above.  Pertinent History Reviewed:  Reviewed past medical,surgical, social, obstetrical and family history.  Reviewed problem list, medications and allergies. Physical Assessment:   Vitals:   04/14/21 1540  BP: 127/83  Pulse: 90  Weight: 191 lb 3.2 oz (86.7 kg)  Height: '5\' 5"'$  (1.651 m)  Body mass index is 31.82 kg/m.       Physical Examination:   General appearance: alert, well appearing, and in no distress  Psych: mood appropriate, normal affect  Skin: warm & dry   Cardiovascular: normal heart rate noted  Respiratory: normal respiratory effort, no distress  Abdomen: soft, non-tender   Pelvic: Right Bartholin's cyst noted ~ 3cm in size- +tenderness with induration, no other abnormalities appreciated  Extremities: no edema    PROCEDURE NOTE: The area was prepped with Iodine and draped in a sterile manner. 1% Lidocaine (3 ml) was then used to infiltrate area on top of the cyst, behind the hymenal ring.  A 7 mm incision was made using a sterile scapel. Upon palpation of the mass, a moderate amount of bloody purulent  drainage was expressed through the incision.  Samples of the drainage were sent for cultures. The open cyst was then copiously irrigated with normal saline. Patient tolerated the procedure well.  Chaperone: Celene Squibb    Assessment & Plan:  1) Vulvar abscess I&D completed Culture sent Reviewed conservative therapy F/u in 1 wk if needed   Return if symptoms worsen or fail to improve.  ADDENDUM: Culture with gram neg rods.  Called with results- doing ok, but still draining.  Rx sent in for Keflex.  F/U as scheduled   Rebecca Pupa, DO Attending [place], [facility] for [facility], [place]

## 2021-04-17 ENCOUNTER — Encounter (INDEPENDENT_AMBULATORY_CARE_PROVIDER_SITE_OTHER): Payer: BC Managed Care – PPO | Admitting: Ophthalmology

## 2021-04-17 MED ORDER — CEPHALEXIN 500 MG PO CAPS: 500.0000 mg | ORAL_CAPSULE | Freq: Two times a day (BID) | ORAL | 0 refills | Status: AC

## 2021-04-17 NOTE — Addendum Note (Signed)
Addended by: Annalee Genta on: 04/17/2021 09:23 AM   Modules accepted: Orders

## 2021-04-22 LAB — WOUND CULTURE

## 2021-04-22 LAB — SPECIMEN STATUS REPORT

## 2021-04-28 ENCOUNTER — Ambulatory Visit: Payer: BC Managed Care – PPO | Admitting: Adult Health

## 2021-05-13 ENCOUNTER — Encounter: Payer: Self-pay | Admitting: Obstetrics & Gynecology

## 2021-05-13 ENCOUNTER — Other Ambulatory Visit: Payer: Self-pay

## 2021-05-13 ENCOUNTER — Ambulatory Visit (INDEPENDENT_AMBULATORY_CARE_PROVIDER_SITE_OTHER): Payer: BC Managed Care – PPO | Admitting: Obstetrics & Gynecology

## 2021-05-13 VITALS — BP 135/85 | HR 83 | Ht 65.0 in | Wt 190.2 lb

## 2021-05-13 DIAGNOSIS — Z1231 Encounter for screening mammogram for malignant neoplasm of breast: Secondary | ICD-10-CM

## 2021-05-13 DIAGNOSIS — Z01419 Encounter for gynecological examination (general) (routine) without abnormal findings: Secondary | ICD-10-CM

## 2021-05-13 NOTE — Progress Notes (Signed)
   WELL-WOMAN EXAMINATION Patient name: Rebecca Burke MRN [id number]  Date of birth: [birth year] Chief Complaint:   Gynecologic Exam  History of Present Illness:   Rebecca Burke is a 65 y.o. [id number] PM, [place] female being seen today for a routine well-woman exam and follow up regarding:  -Bartholin's cyst- I&D completed 8/15- doing well, symptoms have completely resolved   Today she notes no acute complaints or concerns   No LMP recorded. Patient has had a hysterectomy.  Last pap n/a.  Last mammogram: 05/2020. Last colonoscopy: 2016  Depression screen [facility] 2/9 05/13/2021 06/07/2018  Decreased Interest 0 0  Down, Depressed, Hopeless 0 0  PHQ - 2 Score 0 0  Altered sleeping 0 -  Tired, decreased energy 0 -  Change in appetite 0 -  Feeling bad or failure about yourself  0 -  Trouble concentrating 0 -  Moving slowly or fidgety/restless 0 -  Suicidal thoughts 0 -  PHQ-9 Score 0 -    Review of Systems:   Pertinent items are noted in HPI Denies any headaches, blurred vision, fatigue, shortness of breath, chest pain, abdominal pain, bowel movements, urination, or intercourse unless otherwise stated above.  Pertinent History Reviewed:  Reviewed past medical,surgical, social and family history.  Reviewed problem list, medications and allergies. Physical Assessment:   Vitals:   05/13/21 1031  BP: 135/85  Pulse: 83  Weight: 190 lb 3.2 oz (86.3 kg)  Height: '5\' 5"'$  (1.651 m)  Body mass index is 31.65 kg/m.        Physical Examination:   General appearance - well appearing, and in no distress  Mental status - alert, oriented to person, place, and time  Psych:  She has a normal mood and affect  Skin - warm and dry, normal color, no suspicious lesions noted  Chest - effort normal, all lung fields clear to auscultation bilaterally  Heart - normal rate and regular rhythm  Neck:  midline trachea, no thyromegaly or nodules  Breasts - breasts appear normal, no suspicious masses, no  skin or nipple changes or  axillary nodes  Abdomen - soft, nontender, nondistended, no masses or organomegaly  Pelvic - VULVA: normal appearing vulva with no masses, tenderness or lesions, prior cyst resolved  VAGINA: normal appearing vagina with normal color and discharge, no lesions, uterus and cervix absent, vaginal cuff intact ADNEXA: No adnexal masses or tenderness noted.  Extremities:  No swelling or varicosities noted  Chaperone: Celene Squibb     Assessment & Plan:  1) Well-Woman Exam -pap not indicated -mammogram ordered   Orders Placed This Encounter  Procedures   MM 3D SCREEN BREAST BILATERAL    Meds: No orders of the defined types were placed in this encounter.   Follow-up: Return in about 2 years (around 05/14/2023) for Annual. Or as needed   Janyth Pupa, DO Attending [place], [facility] for [employer], [place]

## 2021-05-21 ENCOUNTER — Other Ambulatory Visit: Payer: BC Managed Care – PPO | Admitting: Adult Health

## 2021-05-23 ENCOUNTER — Other Ambulatory Visit (HOSPITAL_COMMUNITY): Payer: Self-pay | Admitting: Internal Medicine

## 2021-05-23 DIAGNOSIS — Z1231 Encounter for screening mammogram for malignant neoplasm of breast: Secondary | ICD-10-CM

## 2021-06-27 ENCOUNTER — Other Ambulatory Visit: Payer: Self-pay

## 2021-06-27 ENCOUNTER — Ambulatory Visit (HOSPITAL_COMMUNITY)
Admission: RE | Admit: 2021-06-27 | Discharge: 2021-06-27 | Disposition: A | Discharge status: Home / Self Care | Payer: BC Managed Care – PPO | Source: Ambulatory Visit | Attending: Internal Medicine | Admitting: Internal Medicine

## 2021-06-27 DIAGNOSIS — Z1231 Encounter for screening mammogram for malignant neoplasm of breast: Secondary | ICD-10-CM | POA: Diagnosis not present

## 2021-09-15 ENCOUNTER — Encounter: Payer: Self-pay | Admitting: *Deleted

## 2022-03-16 ENCOUNTER — Other Ambulatory Visit (HOSPITAL_COMMUNITY): Payer: Self-pay | Admitting: Family Medicine

## 2022-03-16 DIAGNOSIS — Z13820 Encounter for screening for osteoporosis: Secondary | ICD-10-CM

## 2022-03-25 ENCOUNTER — Other Ambulatory Visit (HOSPITAL_COMMUNITY): Payer: BC Managed Care – PPO

## 2022-03-30 ENCOUNTER — Ambulatory Visit (HOSPITAL_COMMUNITY)
Admission: RE | Admit: 2022-03-30 | Discharge: 2022-03-30 | Disposition: A | Discharge status: Home / Self Care | Payer: BC Managed Care – PPO | Source: Ambulatory Visit | Attending: Family Medicine | Admitting: Family Medicine

## 2022-03-30 DIAGNOSIS — Z13820 Encounter for screening for osteoporosis: Secondary | ICD-10-CM | POA: Insufficient documentation

## 2022-08-10 ENCOUNTER — Other Ambulatory Visit (HOSPITAL_COMMUNITY): Payer: Self-pay | Admitting: Internal Medicine

## 2022-08-10 DIAGNOSIS — Z1231 Encounter for screening mammogram for malignant neoplasm of breast: Secondary | ICD-10-CM

## 2022-09-03 ENCOUNTER — Ambulatory Visit (HOSPITAL_COMMUNITY): Payer: BC Managed Care – PPO

## 2022-09-04 ENCOUNTER — Ambulatory Visit (HOSPITAL_COMMUNITY): Payer: BC Managed Care – PPO

## 2022-09-09 ENCOUNTER — Ambulatory Visit (HOSPITAL_COMMUNITY)
Admission: RE | Admit: 2022-09-09 | Discharge: 2022-09-09 | Disposition: A | Discharge status: Home / Self Care | Payer: BC Managed Care – PPO | Source: Ambulatory Visit | Attending: Internal Medicine | Admitting: Internal Medicine

## 2022-09-09 DIAGNOSIS — Z1231 Encounter for screening mammogram for malignant neoplasm of breast: Secondary | ICD-10-CM | POA: Diagnosis not present

## 2023-05-06 ENCOUNTER — Encounter: Payer: Self-pay | Admitting: Obstetrics & Gynecology

## 2023-05-06 ENCOUNTER — Ambulatory Visit (INDEPENDENT_AMBULATORY_CARE_PROVIDER_SITE_OTHER): Payer: BC Managed Care – PPO | Admitting: Obstetrics & Gynecology

## 2023-05-06 VITALS — BP 148/83 | HR 83 | Ht 65.0 in | Wt 200.2 lb

## 2023-05-06 DIAGNOSIS — Z01419 Encounter for gynecological examination (general) (routine) without abnormal findings: Secondary | ICD-10-CM

## 2023-05-06 NOTE — Progress Notes (Signed)
   WELL-WOMAN EXAMINATION Patient name: Rebecca Burke MRN [id number]  Date of birth: [birth year] Chief Complaint:   Gynecologic Exam  History of Present Illness:   Rebecca Burke is a 66 y.o. [id number] PM, [place] female being seen today for a routine well-woman exam.   Patient was last seen in 2022 due to vulvar abscess requiring I&D.  She does note some different on the left side of her vulva.  States it is not pain or swelling, just notes that it feels different when she wipes.  Denies vaginal discharge, bleeding, itching or irritation.  Denies pelvic or abdominal pain.  She otherwise reports no acute GYN concerns   No LMP recorded. Patient has had a hysterectomy.  Last pap not indiated.  Last mammogram: 08/2022. Last colonoscopy: 2016     05/06/2023    4:16 PM 05/13/2021   10:41 AM 06/07/2018    3:07 PM  Depression screen PHQ 2/9  Decreased Interest 0 0 0  Down, Depressed, Hopeless 0 0 0  PHQ - 2 Score 0 0 0  Altered sleeping 0 0   Tired, decreased energy 0 0   Change in appetite 0 0   Feeling bad or failure about yourself  0 0   Trouble concentrating 0 0   Moving slowly or fidgety/restless 0 0   Suicidal thoughts 0 0   PHQ-9 Score 0 0       Review of Systems:   Pertinent items are noted in HPI Denies any headaches, blurred vision, fatigue, shortness of breath, chest pain, abdominal pain, bowel movements, urination, or intercourse unless otherwise stated above.  Pertinent History Reviewed:  Reviewed past medical,surgical, social and family history.  Reviewed problem list, medications and allergies. Physical Assessment:   Vitals:   05/06/23 1532 05/06/23 1555  BP: (!) 146/81 (!) 148/83  Pulse: 88 83  Weight: 200 lb 3.2 oz (90.8 kg)   Height: 5\' 5"  (1.651 m)   Body mass index is 33.32 kg/m.        Physical Examination:   General appearance - well appearing, and in no distress  Mental status - alert, oriented to person, place, and time  Psych:  She has a normal  mood and affect  Skin - warm and dry, normal color, no suspicious lesions noted  Chest - effort normal, all lung fields clear to auscultation bilaterally  Heart - normal rate and regular rhythm  Neck:  midline trachea, no thyromegaly or nodules  Breasts - breasts appear normal, no suspicious masses, no skin or nipple changes or  axillary nodes  Abdomen - soft, nontender, nondistended, no masses or organomegaly  Pelvic - VULVA: normal appearing vulva with no masses,  hyperemia noted on left perineum- no discrete lesion.  No tenderness VAGINA: normal appearing vagina with normal color and discharge, no lesions.  Uterus and cervix surgically absent  ADNEXA: No adnexal masses or tenderness noted.  Extremities:  No calf tenderness bilaterally  Chaperone:  pt declined      Assessment & Plan:  1) Well-Woman Exam -Pap not indicated, mammogram and colonoscopy up-to-date -Reassured patient of benign findings on exam   Follow-up: Return if symptoms worsen or fail to improve.   Myna [place], DO Attending Obstetrician & Gynecologist, [facility] for [employer], [facility]

## 2023-09-10 ENCOUNTER — Other Ambulatory Visit (HOSPITAL_COMMUNITY): Payer: Self-pay | Admitting: Internal Medicine

## 2023-09-10 DIAGNOSIS — Z1231 Encounter for screening mammogram for malignant neoplasm of breast: Secondary | ICD-10-CM

## 2023-09-12 ENCOUNTER — Ambulatory Visit
Admission: EM | Admit: 2023-09-12 | Discharge: 2023-09-12 | Disposition: A | Discharge status: Home / Self Care | Payer: Medicare PPO

## 2023-09-12 DIAGNOSIS — J22 Unspecified acute lower respiratory infection: Secondary | ICD-10-CM

## 2023-09-12 DIAGNOSIS — J0100 Acute maxillary sinusitis, unspecified: Secondary | ICD-10-CM | POA: Diagnosis not present

## 2023-09-12 MED ORDER — AZITHROMYCIN 250 MG PO TABS: ORAL_TABLET | ORAL | 0 refills | Status: DC

## 2023-09-12 MED ORDER — DEXAMETHASONE SODIUM PHOSPHATE 10 MG/ML IJ SOLN
10.0000 mg | Freq: Once | INTRAMUSCULAR | Status: AC
  Administered 2023-09-12: 10 mg via INTRAMUSCULAR

## 2023-09-12 MED ORDER — PROMETHAZINE-DM 6.25-15 MG/5ML PO SYRP: 5.0000 mL | ORAL_SOLUTION | Freq: Four times a day (QID) | ORAL | 0 refills | Status: DC | PRN

## 2023-09-12 NOTE — ED Triage Notes (Signed)
 Pt reports cough, nasal congestion, chest congestion x 2 weeks.

## 2023-09-13 NOTE — H&P (Signed)
 Surgical History & Physical  Patient Name: Rebecca Burke  DOB: 04-Sep-1955  Surgery: Cataract extraction with intraocular lens implant phacoemulsification; Left Eye Surgeon: Lynwood Hermann MD Surgery Date: 09/17/2023 Pre-Op Date: 08/12/2023  HPI: A 73 Yr. old female patient 1. This pt.is here for a cataract evaluation. Pt. was referred by Dr. Darroll. Pt. complains of difficulty with Distance. Both eyes are affected. The episode is constant. Distance vision is worse than near. The complaint is associated with blurry vision. This is negatively affecting the patient's quality of life and the patient is unable to function adequately in life with the current level of vision.Pt. is ready to proceed with cataract surgery. HPI was performed by Lynwood Hermann .  Medical History: Cataracts Diabetes High Blood Pressure Thyroid Problems  Review of Systems Negative Allergic/Immunologic hypertension Cardiovascular Negative Constitutional Negative Ear, Nose, Mouth & Throat Diabetes, thyroid disease Endocrine cataracts Eyes Negative Gastrointestinal Negative Genitourinary Negative Hemotologic/Lymphatic Negative Integumentary Negative Musculoskeletal Negative Neurological Negative Psychiatry Negative Respiratory  Social Never smoked   Medication amlodipine ,  albuterol sulfate ,  montelukast ,  losartan ,  mometasone ,  levothyroxine ,  Ozempic ,  pantoprazole  ,  dicyclomine  ,  ezetimibe ,  Jardiance   Sx/Procedures Gallbladder, Carpal Tunnel  Drug Allergies  tetracycline ,  Sulfa (Sulfonamide Antibiotics) ,  codeine   History & Physical: [place]: cataracts NECK: supple without bruits LUNGS: lungs clear to auscultation CV: regular rate and rhythm Abdomen: soft and non-tender  Impression & Plan: Assessment: 1.  NUCLEAR SCLEROSIS AGE RELATED; Both Eyes (H25.13) 2.  ASTIGMATISM, REGULAR; Both Eyes (H52.223)  Plan: 1.  Cataract accounts for the patient's decreased vision. This visual  impairment is not correctable with a tolerable change in glasses or contact lenses. Cataract surgery with an implantation of a new lens should significantly improve the visual and functional status of the patient. Discussed all risks, benefits, alternatives, and potential complications. Discussed the procedures and recovery. Patient desires to have surgery. A-scan ordered and performed today for intra-ocular lens calculations. The surgery will be performed in order to improve vision for driving, reading, and for eye examinations. Recommend phacoemulsification with intra-ocular lens. Recommend Dextenza  for post-operative pain and inflammation. Left Eye. Dilates poorly - shugarcaine by protocol. Malyugin Ring. Omidira. Recommend Toric Lens.  2.  Recommend toric lens both eyes.

## 2023-09-14 ENCOUNTER — Encounter (HOSPITAL_COMMUNITY): Payer: Self-pay

## 2023-09-14 ENCOUNTER — Encounter (HOSPITAL_COMMUNITY)
Admission: RE | Admit: 2023-09-14 | Discharge: 2023-09-14 | Disposition: A | Discharge status: Home / Self Care | Payer: Medicare PPO | Source: Ambulatory Visit | Attending: Ophthalmology | Admitting: Ophthalmology

## 2023-09-14 HISTORY — DX: Hypothyroidism, unspecified: E03.9

## 2023-09-14 NOTE — Pre-Procedure Instructions (Signed)
 Pt called for Preadmission testing. Pt states that she was seen in Urgent care for upper respiratory infection and cough requiring cough medication and atibiotics. Pt will call office to cancel for 09/16/22 and reschedule.

## 2023-09-15 NOTE — ED Provider Notes (Signed)
 [facility]    CSN: [id number] Arrival date & time: 09/12/23  1048      History   Chief Complaint No chief complaint on file.   HPI Rebecca Burke is a 68 y.o. female.   Patient presenting today with 2-week history of cough, congestion, sinus pain and pressure, chest rattling at times.  Denies fever, chills, shortness of breath, abdominal pain, nausea vomiting or diarrhea.  So far taking numerous over-the-counter cold and congestion medications, Mucinex with minimal relief.  No known history of chronic pulmonary disease.    Past Medical History:  Diagnosis Date   Diabetes mellitus    Diverticulosis    Endometriosis    Gastric ulcer    GERD (gastroesophageal reflux disease)    Hyperplastic colon polyp 08/31/2010   Hypertension    Hypothyroidism    Meningitis     Patient Active Problem List   Diagnosis Date Noted   Screening for colorectal cancer 06/07/2018   Well woman exam with routine gynecological exam 06/07/2018   Joint pain 11/13/2016   Hiatal hernia    History of gastric ulcer    Chronic diarrhea    Peptic ulcer    GERD (gastroesophageal reflux disease) 04/12/2015   Diarrhea 04/12/2015   Hypertension 03/15/2014   Type 2 diabetes mellitus (HCC) 03/15/2014    Past Surgical History:  Procedure Laterality Date   ABDOMINAL HYSTERECTOMY     BACK SURGERY     BRAIN SURGERY     Cerebro spinal fluid leak   CARPAL TUNNEL RELEASE     CESAREAN SECTION     X 2   CHOLECYSTECTOMY     COLONOSCOPY  08/28/2011   Dr.Rourk- suboptimal prep. I- 9mm oval polyp at the splenic flexure. remainder of colonic and rectal mucosa appeared normal. bx= hyperplastic polyp.   COLONOSCOPY N/A 05/16/2015   Dr.Rourk- normal appearing rectal mucosa, scattered L sided diverticula, the remainder of the colonic mucosa appeared normal. Normal segmental biopsies    CSF LEAF     DILATION AND CURETTAGE OF UTERUS     X 3   ESOPHAGOGASTRODUODENOSCOPY N/A 05/16/2015   Dr.Rourk-  elliptical pyloric channel ulcer, gastric erosions, Hiatal hernia bx= benign mucosa   ESOPHAGOGASTRODUODENOSCOPY N/A 08/09/2015   Dr. Shaaron: previously noted gastric ulcer healed. small hiatal hernia    HERNIA REPAIR     left foot surgery     PILONIDAL CYST REMOVED      OB History     Gravida  4   Para  2   Term  2   Preterm      AB  2   Living  2      SAB      IAB      Ectopic      Multiple      Live Births               Home Medications    Prior to Admission medications   Medication Sig Start Date End Date Taking? Authorizing Provider  azithromycin  (ZITHROMAX ) 250 MG tablet Take first 2 tablets together, then 1 every day until finished. 09/12/23  Yes Stuart Vernell Norris, PA-C  promethazine -dextromethorphan (PROMETHAZINE -DM) 6.25-15 MG/5ML syrup Take 5 mLs by mouth 4 (four) times daily as needed. 09/12/23  Yes Stuart Vernell Norris, PA-C  acetaminophen  (TYLENOL ) 650 MG CR tablet Take 1,300 mg by mouth every 8 (eight) hours as needed for pain.    [provider]  albuterol (PROVENTIL HFA;VENTOLIN HFA) 108 (90  BASE) MCG/ACT inhaler Inhale into the lungs every 6 (six) hours as needed for wheezing or shortness of breath.    [provider]  amLODipine (NORVASC) 10 MG tablet Take 5 mg by mouth daily.     [provider]  dicyclomine  (BENTYL ) 10 MG capsule Take 10 mg by mouth 4 (four) times daily -  before meals and at bedtime.    [provider]  empagliflozin (JARDIANCE) 10 MG TABS tablet Take 10 mg by mouth daily.    [provider]  levothyroxine (SYNTHROID) 25 MCG tablet Take 25 mcg by mouth daily.    [provider]  losartan (COZAAR) 100 MG tablet Take 100 mg by mouth daily.      [provider]  mometasone (NASONEX) 50 MCG/ACT nasal spray Place 2 sprays into the nose daily as needed (congestion).    [provider]  montelukast (SINGULAIR) 10 MG tablet Take 10 mg by mouth daily.      [provider]  Multiple Vitamins-Minerals (MULTIVITAMINS THER. W/MINERALS) TABS Take 1 tablet by mouth daily.      [provider]  OZEMPIC, 0.25 OR 0.5 MG/DOSE, 2 MG/1.5ML SOPN  05/20/18   [provider]  pantoprazole  (PROTONIX ) 40 MG tablet TAKE (1) TABLET BY MOUTH TWICE DAILY BEFORE A MEAL. 11/22/19   Marvis Camellia LABOR, NP    Family History Family History  Problem Relation Age of Onset   Diabetes Mother    Hypertension Mother    Stroke Mother    Hypertension Sister    Hypertension Sister    Colon cancer Neg Hx     Social History Social History   Tobacco Use   Smoking status: Former    Current packs/day: 1.00    Average packs/day: 1 pack/day for 29.0 years (29.0 ttl pk-yrs)    Types: Cigarettes   Smokeless tobacco: Never   Tobacco comments:    Quit x 10 years  Substance Use Topics   Alcohol use: No    Alcohol/week: 0.0 standard drinks of alcohol   Drug use: No     Allergies   Other, Codeine, Hydrocodone-acetaminophen , Sulfa antibiotics, and Tetracyclines & related   Review of Systems Review of Systems Per HPI  Physical Exam Triage Vital Signs ED Triage Vitals  Encounter Vitals Group     BP 09/12/23 1051 (!) 153/84     Systolic BP Percentile --      Diastolic BP Percentile --      Pulse Rate 09/12/23 1051 92     Resp 09/12/23 1051 17     Temp 09/12/23 1051 98.1 F (36.7 C)     Temp Source 09/12/23 1051 Oral     SpO2 09/12/23 1051 98 %     Weight --      Height --      Head Circumference --      Peak Flow --      Pain Score 09/12/23 1055 0     Pain Loc --      Pain Education --      Exclude from Growth Chart --    No data found.  Updated Vital Signs BP (!) 153/84 (BP Location: Right Arm)   Pulse 92   Temp 98.1 F (36.7 C) (Oral)   Resp 17   SpO2 98%   Visual Acuity Right Eye Distance:   Left Eye Distance:   Bilateral Distance:    Right Eye Near:   Left Eye Near:    Bilateral  Near:     Physical Exam Vitals and  nursing note reviewed.  Constitutional:      Appearance: Normal appearance.  HENT:     Head: Atraumatic.     Right Ear: Tympanic membrane and external ear normal.     Left Ear: Tympanic membrane and external ear normal.     Nose: Congestion present.     Mouth/Throat:     Mouth: Mucous membranes are moist.     Pharynx: Posterior oropharyngeal erythema present.  Eyes:     Extraocular Movements: Extraocular movements intact.     Conjunctiva/sclera: Conjunctivae normal.  Cardiovascular:     Rate and Rhythm: Normal rate and regular rhythm.     Heart sounds: Normal heart sounds.  Pulmonary:     Effort: Pulmonary effort is normal.     Breath sounds: Rales present. No wheezing.  Musculoskeletal:        General: Normal range of motion.     Cervical back: Normal range of motion and neck supple.  Skin:    General: Skin is warm and dry.  Neurological:     Mental Status: She is alert and oriented to person, place, and time.  Psychiatric:        Mood and Affect: Mood normal.        Thought Content: Thought content normal.      UC Treatments / Results  Labs (all labs ordered are listed, but only abnormal results are displayed) Labs Reviewed - No data to display  EKG   Radiology No results found.  Procedures Procedures (including critical care time)  Medications Ordered in UC Medications  dexamethasone  (DECADRON ) injection 10 mg (10 mg Intramuscular Given 09/12/23 1151)    Initial Impression / Assessment and Plan / UC Course  I have reviewed the triage vital signs and the nursing notes.  Pertinent labs & imaging results that were available during my care of the patient were reviewed by me and considered in my medical decision making (see chart for details).     Given duration worsening course, will treat for sinusitis and lower respiratory infection with IM Decadron , Zithromax , Phenergan  DM, supportive over-the-counter medications and home care.  Return for worsening  symptoms.  Final Clinical Impressions(s) / UC Diagnoses   Final diagnoses:  Lower respiratory infection  Acute maxillary sinusitis, recurrence not specified   Discharge Instructions   None    ED Prescriptions     Medication Sig Dispense Auth. Provider   azithromycin  (ZITHROMAX ) 250 MG tablet Take first 2 tablets together, then 1 every day until finished. 6 tablet Stuart Vernell Norris, PA-C   promethazine -dextromethorphan (PROMETHAZINE -DM) 6.25-15 MG/5ML syrup Take 5 mLs by mouth 4 (four) times daily as needed. 100 mL Stuart Vernell Norris, [place]      PDMP not reviewed this encounter.   Stuart Vernell [place], [place] 09/15/23 [id number]

## 2023-09-29 ENCOUNTER — Encounter (HOSPITAL_COMMUNITY)
Admission: RE | Admit: 2023-09-29 | Discharge: 2023-09-29 | Disposition: A | Discharge status: Home / Self Care | Payer: Medicare PPO | Source: Ambulatory Visit | Attending: Ophthalmology | Admitting: Ophthalmology

## 2023-09-29 ENCOUNTER — Encounter (HOSPITAL_COMMUNITY): Payer: Self-pay

## 2023-09-30 NOTE — H&P (Signed)
Surgical History & Physical  Patient Name: Rebecca Burke  DOB: 1956-05-29  Surgery: Cataract extraction with intraocular lens implant phacoemulsification; Left Eye Surgeon: Fabio Pierce MD Surgery Date: 10/01/2023 Pre-Op Date: 08/12/2023  HPI: A 69 Yr. old female patient 1. This pt.is here for a cataract evaluation. Pt. was referred by Dr. Charise Killian. Pt. complains of difficulty with Distance. Both eyes are affected. The episode is constant. Distance vision is worse than near. The complaint is associated with blurry vision. This is negatively affecting the patient's quality of life and the patient is unable to function adequately in life with the current level of vision.Pt. is ready to proceed with cataract surgery. HPI was performed by Fabio Pierce .  Medical History: Cataracts Diabetes High Blood Pressure Thyroid Problems  Review of Systems Negative Allergic/Immunologic Hypertension Cardiovascular Negative Constitutional Negative Ear, Nose, Mouth & Throat Diabetes / Thyroid disease Endocrine Negative Eyes Negative Gastrointestinal Negative Genitourinary Negative Hemotologic/Lymphatic Negative Integumentary Negative Musculoskeletal Negative Neurological Negative Psychiatry Negative Respiratory  Social Never smoked  Medication amlodipine ,  albuterol sulfate ,  montelukast ,  losartan ,  mometasone ,  levothyroxine ,  Ozempic ,  pantoprazole ,  dicyclomine ,  ezetimibe ,  Jardiance   Sx/Procedures Gallbladder, Carpal Tunnel  Drug Allergies  tetracycline ,  Sulfa (Sulfonamide Antibiotics) ,  codeine   History & Physical: Heent: cataracts NECK: supple without bruits LUNGS: lungs clear to auscultation CV: regular rate and rhythm Abdomen: soft and non-tender  Impression & Plan: Assessment: 1.  NUCLEAR SCLEROSIS AGE RELATED; Both Eyes (H25.13) 2.  ASTIGMATISM, REGULAR; Both Eyes (H52.223)  Plan: 1.  Cataract accounts for the patient's decreased vision. This visual  impairment is not correctable with a tolerable change in glasses or contact lenses. Cataract surgery with an implantation of a new lens should significantly improve the visual and functional status of the patient. Discussed all risks, benefits, alternatives, and potential complications. Discussed the procedures and recovery. Patient desires to have surgery. A-scan ordered and performed today for intra-ocular lens calculations. The surgery will be performed in order to improve vision for driving, reading, and for eye examinations. Recommend phacoemulsification with intra-ocular lens. Recommend Dextenza for post-operative pain and inflammation. Left Eye. Dilates poorly - shugarcaine by protocol. Malyugin Ring. Omidira. Recommend Toric Lens.  2.  Recommend toric lens both eyes.

## 2023-10-01 ENCOUNTER — Encounter (HOSPITAL_COMMUNITY)
Admission: RE | Disposition: A | Discharge status: Home / Self Care | Payer: Self-pay | Source: Home / Self Care | Attending: Ophthalmology

## 2023-10-01 ENCOUNTER — Encounter (HOSPITAL_COMMUNITY): Payer: Self-pay | Admitting: Ophthalmology

## 2023-10-01 ENCOUNTER — Ambulatory Visit (HOSPITAL_COMMUNITY): Payer: Medicare PPO | Admitting: Anesthesiology

## 2023-10-01 ENCOUNTER — Ambulatory Visit (HOSPITAL_COMMUNITY)
Admission: RE | Admit: 2023-10-01 | Discharge: 2023-10-01 | Disposition: A | Discharge status: Home / Self Care | Payer: Medicare PPO | Attending: Ophthalmology | Admitting: Ophthalmology

## 2023-10-01 DIAGNOSIS — K449 Diaphragmatic hernia without obstruction or gangrene: Secondary | ICD-10-CM | POA: Diagnosis not present

## 2023-10-01 DIAGNOSIS — H52223 Regular astigmatism, bilateral: Secondary | ICD-10-CM | POA: Insufficient documentation

## 2023-10-01 DIAGNOSIS — E119 Type 2 diabetes mellitus without complications: Secondary | ICD-10-CM | POA: Diagnosis not present

## 2023-10-01 DIAGNOSIS — H25812 Combined forms of age-related cataract, left eye: Secondary | ICD-10-CM

## 2023-10-01 DIAGNOSIS — I10 Essential (primary) hypertension: Secondary | ICD-10-CM | POA: Diagnosis not present

## 2023-10-01 DIAGNOSIS — E039 Hypothyroidism, unspecified: Secondary | ICD-10-CM | POA: Diagnosis not present

## 2023-10-01 DIAGNOSIS — K279 Peptic ulcer, site unspecified, unspecified as acute or chronic, without hemorrhage or perforation: Secondary | ICD-10-CM | POA: Diagnosis not present

## 2023-10-01 DIAGNOSIS — K219 Gastro-esophageal reflux disease without esophagitis: Secondary | ICD-10-CM | POA: Insufficient documentation

## 2023-10-01 DIAGNOSIS — Z87891 Personal history of nicotine dependence: Secondary | ICD-10-CM | POA: Insufficient documentation

## 2023-10-01 DIAGNOSIS — E1136 Type 2 diabetes mellitus with diabetic cataract: Secondary | ICD-10-CM | POA: Diagnosis not present

## 2023-10-01 DIAGNOSIS — Z7951 Long term (current) use of inhaled steroids: Secondary | ICD-10-CM | POA: Diagnosis not present

## 2023-10-01 DIAGNOSIS — H2513 Age-related nuclear cataract, bilateral: Secondary | ICD-10-CM | POA: Insufficient documentation

## 2023-10-01 HISTORY — PX: CATARACT EXTRACTION W/PHACO: SHX586

## 2023-10-01 LAB — GLUCOSE, CAPILLARY: Glucose-Capillary: 127 mg/dL — ABNORMAL HIGH (ref 70–99)

## 2023-10-01 SURGERY — PHACOEMULSIFICATION, CATARACT, WITH IOL INSERTION
Anesthesia: General | Site: Eye | Laterality: Left

## 2023-10-01 MED ORDER — PHENYLEPHRINE-KETOROLAC 1-0.3 % IO SOLN
INTRAOCULAR | Status: DC | PRN
  Administered 2023-10-01: 500 mL via OPHTHALMIC

## 2023-10-01 MED ORDER — BSS IO SOLN
INTRAOCULAR | Status: DC | PRN
  Administered 2023-10-01: 15 mL via INTRAOCULAR

## 2023-10-01 MED ORDER — LIDOCAINE HCL 3.5 % OP GEL
1.0000 | Freq: Once | OPHTHALMIC | Status: AC
  Administered 2023-10-01: 1 via OPHTHALMIC

## 2023-10-01 MED ORDER — LIDOCAINE HCL (PF) 1 % IJ SOLN
INTRAOCULAR | Status: DC | PRN
  Administered 2023-10-01: 1 mL via OPHTHALMIC

## 2023-10-01 MED ORDER — FENTANYL CITRATE (PF) 100 MCG/2ML IJ SOLN
INTRAMUSCULAR | Status: DC | PRN
  Administered 2023-10-01: 50 ug via INTRAVENOUS

## 2023-10-01 MED ORDER — STERILE WATER FOR IRRIGATION IR SOLN
Status: DC | PRN
  Administered 2023-10-01: 25 mL

## 2023-10-01 MED ORDER — SODIUM HYALURONATE 10 MG/ML IO SOLUTION
PREFILLED_SYRINGE | INTRAOCULAR | Status: DC | PRN
  Administered 2023-10-01: .85 mL via INTRAOCULAR

## 2023-10-01 MED ORDER — MIDAZOLAM HCL 2 MG/2ML IJ SOLN
INTRAMUSCULAR | Status: AC
  Filled 2023-10-01: qty 2

## 2023-10-01 MED ORDER — PHENYLEPHRINE HCL 2.5 % OP SOLN
1.0000 [drp] | OPHTHALMIC | Status: AC | PRN
  Administered 2023-10-01 (×3): 1 [drp] via OPHTHALMIC

## 2023-10-01 MED ORDER — MIDAZOLAM HCL 2 MG/2ML IJ SOLN
INTRAMUSCULAR | Status: DC | PRN
  Administered 2023-10-01: 1 mg via INTRAVENOUS

## 2023-10-01 MED ORDER — TETRACAINE HCL 0.5 % OP SOLN
1.0000 [drp] | OPHTHALMIC | Status: AC | PRN
  Administered 2023-10-01 (×3): 1 [drp] via OPHTHALMIC

## 2023-10-01 MED ORDER — SODIUM HYALURONATE 23MG/ML IO SOSY
PREFILLED_SYRINGE | INTRAOCULAR | Status: DC | PRN
  Administered 2023-10-01: .6 mL via INTRAOCULAR

## 2023-10-01 MED ORDER — SODIUM CHLORIDE 0.9% FLUSH
INTRAVENOUS | Status: DC | PRN
  Administered 2023-10-01: 10 mL via INTRAVENOUS

## 2023-10-01 MED ORDER — POVIDONE-IODINE 5 % OP SOLN
OPHTHALMIC | Status: DC | PRN
  Administered 2023-10-01: 1 via OPHTHALMIC

## 2023-10-01 MED ORDER — FENTANYL CITRATE (PF) 100 MCG/2ML IJ SOLN
INTRAMUSCULAR | Status: AC
  Filled 2023-10-01: qty 2

## 2023-10-01 MED ORDER — MOXIFLOXACIN HCL 5 MG/ML IO SOLN
INTRAOCULAR | Status: DC | PRN
  Administered 2023-10-01: .3 mL via INTRACAMERAL

## 2023-10-01 MED ORDER — TROPICAMIDE 1 % OP SOLN
1.0000 [drp] | OPHTHALMIC | Status: AC | PRN
  Administered 2023-10-01 (×3): 1 [drp] via OPHTHALMIC

## 2023-10-01 SURGICAL SUPPLY — 13 items
CATARACT SUITE SIGHTPATH (MISCELLANEOUS) ×1 IMPLANT
CLOTH BEACON ORANGE TIMEOUT ST (SAFETY) ×1 IMPLANT
EYE SHIELD UNIVERSAL CLEAR (GAUZE/BANDAGES/DRESSINGS) IMPLANT
FEE CATARACT SUITE SIGHTPATH (MISCELLANEOUS) ×1 IMPLANT
GLOVE BIOGEL PI IND STRL 6.5 (GLOVE) IMPLANT
GLOVE BIOGEL PI IND STRL 7.0 (GLOVE) ×2 IMPLANT
LENS IOL TECNIS EYHANCE 26.5 (Intraocular Lens) IMPLANT
NDL HYPO 18GX1.5 BLUNT FILL (NEEDLE) ×1 IMPLANT
NEEDLE HYPO 18GX1.5 BLUNT FILL (NEEDLE) ×1 IMPLANT
PAD ARMBOARD 7.5X6 YLW CONV (MISCELLANEOUS) ×1 IMPLANT
SYR TB 1ML LL NO SAFETY (SYRINGE) ×1 IMPLANT
TAPE SURG TRANSPORE 1 IN (GAUZE/BANDAGES/DRESSINGS) IMPLANT
WATER STERILE IRR 250ML POUR (IV SOLUTION) ×1 IMPLANT

## 2023-10-01 NOTE — Transfer of Care (Signed)
Immediate Anesthesia Transfer of Care Note  Patient: Rebecca Burke  Procedure(s) Performed: CATARACT EXTRACTION PHACO AND INTRAOCULAR LENS PLACEMENT (IOC) (Left: Eye)  Patient Location: PACU  Anesthesia Type:MAC  Level of Consciousness: awake, alert , and oriented  Airway & Oxygen Therapy: Patient Spontanous Breathing  Post-op Assessment: Report given to RN and Post -op Vital signs reviewed and stable  Post vital signs: Reviewed and stable  Last Vitals:  Vitals Value Taken Time  BP 150/76   Temp 98   Pulse 75   Resp 16   SpO2 96     Last Pain:  Vitals:   10/01/23 0903  PainSc: 0-No pain         Complications: No notable events documented.

## 2023-10-01 NOTE — Interval H&P Note (Signed)
History and Physical Interval Note:  10/01/2023 9:45 AM  [name]  has presented today for surgery, with the diagnosis of age related nuclear cataract, left eye.  The various methods of treatment have been discussed with the patient and family. After consideration of risks, benefits and other options for treatment, the patient has consented to  Procedure(s) with comments: CATARACT EXTRACTION PHACO AND INTRAOCULAR LENS PLACEMENT (IOC) (Left) - CDE: as a surgical intervention.  The patient's history has been reviewed, patient examined, no change in status, stable for surgery.  I have reviewed the patient's chart and labs.  Questions were answered to the patient's satisfaction.     Rebecca Burke

## 2023-10-01 NOTE — Discharge Instructions (Signed)
 Please discharge patient when stable, will follow up today with Dr. June Leap at the [facility] office immediately following discharge.  Leave shield in place until visit.  All paperwork with discharge instructions will be given at the office.  [facility] Address:  [address]  [place], [place] [address]

## 2023-10-01 NOTE — Op Note (Signed)
Date of procedure: 10/01/23  Pre-operative diagnosis: Visually significant age-related combined cataract, Left Eye (H25.812)  Post-operative diagnosis: Visually significant age-related combined cataract, Left Eye (H25.812)  Procedure: Removal of cataract via phacoemulsification and insertion of intra-ocular lens Laural Benes and Kosak DIB00 +26.5D into the capsular bag of the Left Eye  Attending surgeon: Rudy Jew. Johnpaul Gillentine, MD, MA  Anesthesia: MAC, Topical Akten  Complications: None  Estimated Blood Loss: <71mL (minimal)  Specimens: None  Implants: As above  Indications:  Visually significant age-related cataract, Left Eye  Procedure:  The patient was seen and identified in the pre-operative area. The operative eye was identified and dilated.  The operative eye was marked.  Topical anesthesia was administered to the operative eye.     The patient was then to the operative suite and placed in the supine position.  A timeout was performed confirming the patient, procedure to be performed, and all other relevant information.   The patient's face was prepped and draped in the usual fashion for intra-ocular surgery.  A lid speculum was placed into the operative eye and the surgical microscope moved into place and focused.  An inferotemporal paracentesis was created using a 20 gauge paracentesis blade.  Shugarcaine was injected into the anterior chamber.  Viscoelastic was injected into the anterior chamber.  A temporal clear-corneal main wound incision was created using a 2.29mm microkeratome.  A continuous curvilinear capsulorrhexis was initiated using an irrigating cystitome and completed using capsulorrhexis forceps.  Hydrodissection and hydrodeliniation were performed.  Viscoelastic was injected into the anterior chamber.  A phacoemulsification handpiece and a chopper as a second instrument were used to remove the nucleus and epinucleus. The irrigation/aspiration handpiece was used to remove any  remaining cortical material.   The capsular bag was reinflated with viscoelastic, checked, and found to be intact.  The intraocular lens was inserted into the capsular bag.  The irrigation/aspiration handpiece was used to remove any remaining viscoelastic.  The clear corneal wound and paracentesis wounds were then hydrated and checked with Weck-Cels to be watertight. 0.57mL of Moxfloxacin was injected into the anterior chamber. The lid-speculum was removed.  The drape was removed.  The patient's face was cleaned with a wet and dry 4x4.    A clear shield was taped over the eye. The patient was taken to the post-operative care unit in good condition, having tolerated the procedure well.  Post-Op Instructions: The patient will follow up at [facility] for a same day post-operative evaluation and will receive all other orders and instructions.

## 2023-10-01 NOTE — Anesthesia Preprocedure Evaluation (Signed)
Anesthesia Evaluation  Patient identified by MRN, date of birth, ID band Patient awake    Reviewed: Allergy & Precautions, H&P , NPO status , Patient's Chart, lab work & pertinent test results, reviewed documented beta blocker date and time   Airway Mallampati: II  TM Distance: >3 FB Neck ROM: full    Dental no notable dental hx.    Pulmonary neg pulmonary ROS, former smoker   Pulmonary exam normal breath sounds clear to auscultation       Cardiovascular Exercise Tolerance: Good hypertension, negative cardio ROS  Rhythm:regular Rate:Normal     Neuro/Psych  Neuromuscular disease negative neurological ROS  negative psych ROS   GI/Hepatic negative GI ROS, Neg liver ROS, hiatal hernia, PUD,GERD  ,,  Endo/Other  negative endocrine ROSdiabetesHypothyroidism    Renal/GU negative Renal ROS  negative genitourinary   Musculoskeletal   Abdominal   Peds  Hematology negative hematology ROS (+)   Anesthesia Other Findings   Reproductive/Obstetrics negative OB ROS                             Anesthesia Physical Anesthesia Plan  ASA: 2  Anesthesia Plan: General   Post-op Pain Management:    Induction:   PONV Risk Score and Plan: Propofol infusion  Airway Management Planned:   Additional Equipment:   Intra-op Plan:   Post-operative Plan:   Informed Consent: I have reviewed the patients History and Physical, chart, labs and discussed the procedure including the risks, benefits and alternatives for the proposed anesthesia with the patient or authorized representative who has indicated his/her understanding and acceptance.     Dental Advisory Given  Plan Discussed with: CRNA  Anesthesia Plan Comments:        Anesthesia Quick Evaluation

## 2023-10-02 NOTE — Anesthesia Postprocedure Evaluation (Signed)
Anesthesia Post Note  Patient: Rebecca Burke  Procedure(s) Performed: CATARACT EXTRACTION PHACO AND INTRAOCULAR LENS PLACEMENT (IOC) (Left: Eye)  Patient location during evaluation: Phase II Anesthesia Type: General Level of consciousness: awake Pain management: pain level controlled Vital Signs Assessment: post-procedure vital signs reviewed and stable Respiratory status: spontaneous breathing and respiratory function stable Cardiovascular status: blood pressure returned to baseline and stable Postop Assessment: no headache and no apparent nausea or vomiting Anesthetic complications: no Comments: Late entry   No notable events documented.   Last Vitals:  Vitals:   10/01/23 0930 10/01/23 1012  BP:  (!) 144/93  Pulse: 75 77  Resp: (!) 25 16  Temp:  36.7 C  SpO2: 98% 100%    Last Pain:  Vitals:   10/01/23 1012  TempSrc: Oral  PainSc: 0-No pain                 Windell [place]

## 2023-10-03 ENCOUNTER — Encounter (HOSPITAL_COMMUNITY): Payer: Self-pay | Admitting: Ophthalmology

## 2023-10-07 DIAGNOSIS — H269 Unspecified cataract: Secondary | ICD-10-CM | POA: Diagnosis not present

## 2023-10-07 DIAGNOSIS — J302 Other seasonal allergic rhinitis: Secondary | ICD-10-CM | POA: Diagnosis not present

## 2023-10-07 DIAGNOSIS — Z87891 Personal history of nicotine dependence: Secondary | ICD-10-CM | POA: Diagnosis not present

## 2023-10-08 ENCOUNTER — Ambulatory Visit (HOSPITAL_COMMUNITY)
Admission: RE | Admit: 2023-10-08 | Discharge: 2023-10-08 | Disposition: A | Discharge status: Home / Self Care | Payer: Medicare PPO | Source: Ambulatory Visit | Attending: Internal Medicine | Admitting: Internal Medicine

## 2023-10-08 DIAGNOSIS — Z1231 Encounter for screening mammogram for malignant neoplasm of breast: Secondary | ICD-10-CM | POA: Insufficient documentation

## 2023-11-15 DIAGNOSIS — H25811 Combined forms of age-related cataract, right eye: Secondary | ICD-10-CM | POA: Diagnosis not present

## 2023-11-22 NOTE — H&P (Signed)
 Surgical History & Physical  Patient Name: Rebecca Burke DOB: 08-08-1956  Surgery: Cataract extraction with intraocular lens implant phacoemulsification; Right Eye  Surgeon: Fabio Pierce MD Surgery Date:  12-03-23 Pre-Op Date:  11-15-23  HPI: A 15 Yr. old female patient 1. The patient is returning after cataract surgery. The left eye is affected. Since the last visit, the affected area is doing well. The patient's vision is improved. The condition's severity is constant. Patient has finished meds as instructed. Pt. is ready to proceed with cataract surgery on OD, due to having blurred vision with distance, and near. Pt. also experiences glare when driving at night. HPI was performed by Fabio Pierce .  Medical History: Cataracts Diabetic Retinopathy- sisiter Diabetes High Blood Pressure Thyroid Problems  Review of Systems Negative Allergic/Immunologic Negative Cardiovascular Negative Constitutional Negative Ear, Nose, Mouth & Throat Negative Endocrine Negative Eyes Negative Gastrointestinal Negative Genitourinary Negative Hemotologic/Lymphatic Negative Integumentary Negative Musculoskeletal Negative Neurological Negative Psychiatry Negative Respiratory  Social   Never smoked   Medication Prednisolone-moxiflox-bromfen,  amlodipine ,  albuterol sulfate ,  montelukast ,  losartan ,  mometasone ,  levothyroxine ,  Ozempic ,  pantoprazole ,  dicyclomine ,  ezetimibe ,  Jardiance ,   Sx/Procedures Phaco c IOL OS,  Gallbladder, Carpal Tunnel,   Drug Allergies  tetracycline ,  Sulfa (Sulfonamide Antibiotics) ,  codeine ,   History & Physical: Heent: cataract; right eye NECK: supple without bruits LUNGS: lungs clear to auscultation CV: regular rate and rhythm Abdomen: soft and non-tender Impression & Plan: Assessment: 1.  CATARACT EXTRACTION STATUS; Left Eye (Z98.42) 2.  COMBINED FORMS AGE RELATED CATARACT; Right Eye (H25.811) 3.  DERMATOCHALASIS, no surgery;  Right Upper Lid, Left Upper Lid (H02.831, H02.834) 4.  BLEPHARITIS; Right Upper Lid, Right Lower Lid, Left Upper Lid, Left Lower Lid (H01.001, H01.002,H01.004,H01.005) 5.  Pinguecula; Both Eyes (H11.153)  Plan: 1.  1 month after Phaco PCIOL. Doing very well with improved vision and normal IOP. Stop all post-operative medications. Call with any concerning symptoms.  2.  Cataract accounts for the patient's decreased vision. This visual impairment is not correctable with a tolerable change in glasses or contact lenses. Cataract surgery with an implantation of a new lens should significantly improve the visual and functional status of the patient. Discussed all risks, benefits, alternatives, and potential complications. Discussed the procedures and recovery. Patient desires to have surgery. A-scan ordered and performed today for intra-ocular lens calculations. The surgery will be performed in order to improve vision for driving, reading, and for eye examinations. Recommend phacoemulsification with intra-ocular lens. Recommend Dextenza for post-operative pain and inflammation. Right Eye. Surgery required to correct imbalance of vision. Dilates poorly - shugarcaine by protocol. Malyugin Ring. Omidira.  3.  Asymptomatic, recommend observation for now. Findings, prognosis and treatment options reviewed.  4.  Blepharitis is present - recommend regular lid cleaning.  5.  Observe; Artificial tears as needed for irritation.

## 2023-11-29 ENCOUNTER — Other Ambulatory Visit: Payer: Self-pay

## 2023-11-29 ENCOUNTER — Encounter (HOSPITAL_COMMUNITY): Payer: Self-pay

## 2023-11-30 ENCOUNTER — Encounter (HOSPITAL_COMMUNITY)
Admission: RE | Admit: 2023-11-30 | Discharge: 2023-11-30 | Disposition: A | Discharge status: Home / Self Care | Source: Ambulatory Visit | Attending: Ophthalmology | Admitting: Ophthalmology

## 2023-12-03 ENCOUNTER — Ambulatory Visit (HOSPITAL_BASED_OUTPATIENT_CLINIC_OR_DEPARTMENT_OTHER): Admitting: Certified Registered"

## 2023-12-03 ENCOUNTER — Encounter (HOSPITAL_COMMUNITY): Payer: Self-pay | Admitting: Ophthalmology

## 2023-12-03 ENCOUNTER — Ambulatory Visit (HOSPITAL_COMMUNITY)
Admission: RE | Admit: 2023-12-03 | Discharge: 2023-12-03 | Disposition: A | Discharge status: Home / Self Care | Attending: Ophthalmology | Admitting: Ophthalmology

## 2023-12-03 ENCOUNTER — Encounter (HOSPITAL_COMMUNITY)
Admission: RE | Disposition: A | Discharge status: Home / Self Care | Payer: Self-pay | Source: Home / Self Care | Attending: Ophthalmology

## 2023-12-03 ENCOUNTER — Ambulatory Visit (HOSPITAL_COMMUNITY): Admitting: Certified Registered"

## 2023-12-03 DIAGNOSIS — K449 Diaphragmatic hernia without obstruction or gangrene: Secondary | ICD-10-CM | POA: Diagnosis not present

## 2023-12-03 DIAGNOSIS — K219 Gastro-esophageal reflux disease without esophagitis: Secondary | ICD-10-CM | POA: Insufficient documentation

## 2023-12-03 DIAGNOSIS — H25811 Combined forms of age-related cataract, right eye: Secondary | ICD-10-CM | POA: Insufficient documentation

## 2023-12-03 DIAGNOSIS — Z9842 Cataract extraction status, left eye: Secondary | ICD-10-CM | POA: Insufficient documentation

## 2023-12-03 DIAGNOSIS — G709 Myoneural disorder, unspecified: Secondary | ICD-10-CM | POA: Insufficient documentation

## 2023-12-03 DIAGNOSIS — E1136 Type 2 diabetes mellitus with diabetic cataract: Secondary | ICD-10-CM | POA: Diagnosis not present

## 2023-12-03 DIAGNOSIS — H02834 Dermatochalasis of left upper eyelid: Secondary | ICD-10-CM | POA: Diagnosis not present

## 2023-12-03 DIAGNOSIS — Z8711 Personal history of peptic ulcer disease: Secondary | ICD-10-CM | POA: Insufficient documentation

## 2023-12-03 DIAGNOSIS — Z87891 Personal history of nicotine dependence: Secondary | ICD-10-CM | POA: Insufficient documentation

## 2023-12-03 DIAGNOSIS — H0100A Unspecified blepharitis right eye, upper and lower eyelids: Secondary | ICD-10-CM | POA: Insufficient documentation

## 2023-12-03 DIAGNOSIS — H11153 Pinguecula, bilateral: Secondary | ICD-10-CM | POA: Diagnosis not present

## 2023-12-03 DIAGNOSIS — H02831 Dermatochalasis of right upper eyelid: Secondary | ICD-10-CM | POA: Insufficient documentation

## 2023-12-03 DIAGNOSIS — E039 Hypothyroidism, unspecified: Secondary | ICD-10-CM | POA: Diagnosis not present

## 2023-12-03 DIAGNOSIS — I10 Essential (primary) hypertension: Secondary | ICD-10-CM | POA: Insufficient documentation

## 2023-12-03 DIAGNOSIS — H0100B Unspecified blepharitis left eye, upper and lower eyelids: Secondary | ICD-10-CM | POA: Insufficient documentation

## 2023-12-03 DIAGNOSIS — E119 Type 2 diabetes mellitus without complications: Secondary | ICD-10-CM

## 2023-12-03 DIAGNOSIS — H2511 Age-related nuclear cataract, right eye: Secondary | ICD-10-CM | POA: Diagnosis present

## 2023-12-03 HISTORY — PX: CATARACT EXTRACTION W/PHACO: SHX586

## 2023-12-03 LAB — GLUCOSE, CAPILLARY: Glucose-Capillary: 147 mg/dL — ABNORMAL HIGH (ref 70–99)

## 2023-12-03 SURGERY — PHACOEMULSIFICATION, CATARACT, WITH IOL INSERTION
Anesthesia: Monitor Anesthesia Care | Site: Eye | Laterality: Right

## 2023-12-03 MED ORDER — SODIUM HYALURONATE 10 MG/ML IO SOLUTION
PREFILLED_SYRINGE | INTRAOCULAR | Status: DC | PRN
  Administered 2023-12-03: .85 mL via INTRAOCULAR

## 2023-12-03 MED ORDER — LACTATED RINGERS IV SOLN: INTRAVENOUS | Status: DC

## 2023-12-03 MED ORDER — MIDAZOLAM HCL 2 MG/2ML IJ SOLN
INTRAMUSCULAR | Status: AC
  Filled 2023-12-03: qty 2

## 2023-12-03 MED ORDER — MIDAZOLAM HCL 2 MG/2ML IJ SOLN
INTRAMUSCULAR | Status: DC | PRN
  Administered 2023-12-03: 1 mg via INTRAVENOUS

## 2023-12-03 MED ORDER — TETRACAINE HCL 0.5 % OP SOLN
1.0000 [drp] | OPHTHALMIC | Status: AC | PRN
  Administered 2023-12-03 (×3): 1 [drp] via OPHTHALMIC

## 2023-12-03 MED ORDER — POVIDONE-IODINE 5 % OP SOLN
OPHTHALMIC | Status: DC | PRN
  Administered 2023-12-03: 1 via OPHTHALMIC

## 2023-12-03 MED ORDER — LIDOCAINE HCL (PF) 1 % IJ SOLN
INTRAOCULAR | Status: DC | PRN
  Administered 2023-12-03: 1 mL via OPHTHALMIC

## 2023-12-03 MED ORDER — LIDOCAINE HCL 3.5 % OP GEL
1.0000 | Freq: Once | OPHTHALMIC | Status: AC
  Administered 2023-12-03: 1 via OPHTHALMIC

## 2023-12-03 MED ORDER — PHENYLEPHRINE HCL 2.5 % OP SOLN
1.0000 [drp] | OPHTHALMIC | Status: AC | PRN
  Administered 2023-12-03 (×3): 1 [drp] via OPHTHALMIC

## 2023-12-03 MED ORDER — BSS IO SOLN
INTRAOCULAR | Status: DC | PRN
  Administered 2023-12-03: 15 mL via INTRAOCULAR

## 2023-12-03 MED ORDER — SODIUM HYALURONATE 23MG/ML IO SOSY
PREFILLED_SYRINGE | INTRAOCULAR | Status: DC | PRN
  Administered 2023-12-03: .6 mL via INTRAOCULAR

## 2023-12-03 MED ORDER — TROPICAMIDE 1 % OP SOLN
1.0000 [drp] | OPHTHALMIC | Status: AC | PRN
  Administered 2023-12-03 (×3): 1 [drp] via OPHTHALMIC

## 2023-12-03 MED ORDER — SODIUM CHLORIDE 0.9% FLUSH
INTRAVENOUS | Status: DC | PRN
  Administered 2023-12-03: 8 mL via INTRAVENOUS

## 2023-12-03 MED ORDER — PHENYLEPHRINE-KETOROLAC 1-0.3 % IO SOLN
INTRAOCULAR | Status: DC | PRN
  Administered 2023-12-03: 500 mL via OPHTHALMIC

## 2023-12-03 MED ORDER — MOXIFLOXACIN HCL 5 MG/ML IO SOLN
INTRAOCULAR | Status: DC | PRN
  Administered 2023-12-03: .3 mL via INTRACAMERAL

## 2023-12-03 MED ORDER — STERILE WATER FOR IRRIGATION IR SOLN
Status: DC | PRN
  Administered 2023-12-03: 250 mL

## 2023-12-03 SURGICAL SUPPLY — 12 items
CATARACT SUITE SIGHTPATH (MISCELLANEOUS) ×2 IMPLANT
CLOTH BEACON ORANGE TIMEOUT ST (SAFETY) ×2 IMPLANT
EYE SHIELD UNIVERSAL CLEAR (GAUZE/BANDAGES/DRESSINGS) ×1 IMPLANT
FEE CATARACT SUITE SIGHTPATH (MISCELLANEOUS) ×1 IMPLANT
GLOVE BIOGEL PI IND STRL 7.0 (GLOVE) ×4 IMPLANT
LENS IOL TECNIS EYHANCE 26.0 (Intraocular Lens) ×1 IMPLANT
NDL HYPO 18GX1.5 BLUNT FILL (NEEDLE) ×1 IMPLANT
NEEDLE HYPO 18GX1.5 BLUNT FILL (NEEDLE) ×2 IMPLANT
PAD ARMBOARD POSITIONER FOAM (MISCELLANEOUS) ×2 IMPLANT
SYR TB 1ML LL NO SAFETY (SYRINGE) ×2 IMPLANT
TAPE SURG TRANSPORE 1 IN (GAUZE/BANDAGES/DRESSINGS) ×1 IMPLANT
WATER STERILE IRR 250ML POUR (IV SOLUTION) ×2 IMPLANT

## 2023-12-03 NOTE — Anesthesia Procedure Notes (Signed)
 Procedure Name: MAC Date/Time: 12/03/2023 9:25 AM  Performed by: Julian Reil, CRNAPre-anesthesia Checklist: Patient identified, Emergency Drugs available, Suction available and Patient being monitored Patient Re-evaluated:Patient Re-evaluated prior to induction Oxygen Delivery Method: Nasal cannula Placement Confirmation: positive ETCO2

## 2023-12-03 NOTE — Transfer of Care (Signed)
 Immediate Anesthesia Transfer of Care Note  Patient: Rebecca Burke  Procedure(s) Performed: PHACOEMULSIFICATION, CATARACT, WITH IOL INSERTION (Right: Eye)  Patient Location: Short Stay  Anesthesia Type:MAC  Level of Consciousness: awake, alert , and oriented  Airway & Oxygen Therapy: Patient Spontanous Breathing  Post-op Assessment: Report given to RN and Post -op Vital signs reviewed and stable  Post vital signs: Reviewed and stable  Last Vitals:  Vitals Value Taken Time  BP    Temp    Pulse    Resp    SpO2      Last Pain:  Vitals:   12/03/23 0840  TempSrc:   PainSc: 0-No pain         Complications: No notable events documented.

## 2023-12-03 NOTE — Interval H&P Note (Signed)
 History and Physical Interval Note:  12/03/2023 9:13 AM  [name]  has presented today for surgery, with the diagnosis of nuclear sclerotic cataract, right eye.  The various methods of treatment have been discussed with the patient and family. After consideration of risks, benefits and other options for treatment, the patient has consented to  Procedure(s): PHACOEMULSIFICATION, CATARACT, WITH IOL INSERTION (Right) INSERTION, STENT, DRUG-ELUTING, LACRIMAL CANALICULUS (Right) as a surgical intervention.  The patient's history has been reviewed, patient examined, no change in status, stable for surgery.  I have reviewed the patient's chart and labs.  Questions were answered to the patient's satisfaction.     Rebecca Burke

## 2023-12-03 NOTE — Anesthesia Preprocedure Evaluation (Signed)
 Anesthesia Evaluation  Patient identified by MRN, date of birth, ID band Patient awake    Reviewed: Allergy & Precautions, H&P , NPO status , Patient's Chart, lab work & pertinent test results, reviewed documented beta blocker date and time   Airway Mallampati: II  TM Distance: >3 FB Neck ROM: full    Dental no notable dental hx. (+) Dental Advisory Given, Teeth Intact   Pulmonary neg pulmonary ROS, former smoker   Pulmonary exam normal breath sounds clear to auscultation       Cardiovascular Exercise Tolerance: Good hypertension, negative cardio ROS Normal cardiovascular exam Rhythm:regular Rate:Normal     Neuro/Psych  Neuromuscular disease  negative psych ROS   GI/Hepatic Neg liver ROS, hiatal hernia, PUD,GERD  ,,  Endo/Other  diabetes, Type 2Hypothyroidism    Renal/GU negative Renal ROS  negative genitourinary   Musculoskeletal   Abdominal   Peds  Hematology negative hematology ROS (+)   Anesthesia Other Findings   Reproductive/Obstetrics negative OB ROS                             Anesthesia Physical Anesthesia Plan  ASA: 2  Anesthesia Plan: MAC   Post-op Pain Management: Minimal or no pain anticipated   Induction:   PONV Risk Score and Plan: Midazolam  Airway Management Planned: Natural Airway and Nasal Cannula  Additional Equipment:   Intra-op Plan:   Post-operative Plan:   Informed Consent: I have reviewed the patients History and Physical, chart, labs and discussed the procedure including the risks, benefits and alternatives for the proposed anesthesia with the patient or authorized representative who has indicated his/her understanding and acceptance.     Dental Advisory Given  Plan Discussed with: CRNA  Anesthesia Plan Comments:        Anesthesia Quick Evaluation

## 2023-12-03 NOTE — Discharge Instructions (Addendum)
 Please discharge patient when stable, will follow up today with Dr. June Leap at the [facility] office immediately following discharge.  Leave shield in place until visit.  All paperwork with discharge instructions will be given at the office.  [facility] Address:  [address]  [place], [place] [address]

## 2023-12-03 NOTE — Op Note (Signed)
 Date of procedure: 12/03/23  Pre-operative diagnosis:  Visually significant combined form age-related cataract, Right Eye (H25.811)  Post-operative diagnosis:  Visually significant combined form age-related cataract, Right Eye (H25.811)  Procedure: Removal of cataract via phacoemulsification and insertion of intra-ocular lens Degrace and [place] DIB00 +26.0D into the capsular bag of the Right Eye  Attending surgeon: Rudy Jew. Cattaleya Wien, MD, MA  Anesthesia: MAC, Topical Akten  Complications: None  Estimated Blood Loss: <8mL (minimal)  Specimens: None  Implants: As above  Indications:  Visually significant age-related cataract, Right Eye  Procedure:  The patient was seen and identified in the pre-operative area. The operative eye was identified and dilated.  The operative eye was marked.  Topical anesthesia was administered to the operative eye.     The patient was then to the operative suite and placed in the supine position.  A timeout was performed confirming the patient, procedure to be performed, and all other relevant information.   The patient's face was prepped and draped in the usual fashion for intra-ocular surgery.  A lid speculum was placed into the operative eye and the surgical microscope moved into place and focused.  A superotemporal paracentesis was created using a 20 gauge paracentesis blade.  Shugarcaine was injected into the anterior chamber.  Viscoelastic was injected into the anterior chamber.  A temporal clear-corneal main wound incision was created using a 2.10mm microkeratome.  A continuous curvilinear capsulorrhexis was initiated using an irrigating cystitome and completed using capsulorrhexis forceps.  Hydrodissection and hydrodeliniation were performed.  Viscoelastic was injected into the anterior chamber.  A phacoemulsification handpiece and a chopper as a second instrument were used to remove the nucleus and epinucleus. The irrigation/aspiration handpiece was used to  remove any remaining cortical material.   The capsular bag was reinflated with viscoelastic, checked, and found to be intact.  The intraocular lens was inserted into the capsular bag.  The irrigation/aspiration handpiece was used to remove any remaining viscoelastic.  The clear corneal wound and paracentesis wounds were then hydrated and checked with Weck-Cels to be watertight. 0.69mL of Moxfloxacin was injected into the anterior chamber. The lid-speculum was removed.  The drape was removed.  The patient's face was cleaned with a wet and dry 4x4. A clear shield was taped over the eye. The patient was taken to the post-operative care unit in good condition, having tolerated the procedure well.  Post-Op Instructions: The patient will follow up at [facility] for a same day post-operative evaluation and will receive all other orders and instructions.

## 2023-12-03 NOTE — Anesthesia Postprocedure Evaluation (Signed)
 Anesthesia Post Note  Patient: Rebecca Burke  Procedure(s) Performed: PHACOEMULSIFICATION, CATARACT, WITH IOL INSERTION (Right: Eye)  Patient location during evaluation: PACU Anesthesia Type: MAC Level of consciousness: awake and alert Pain management: pain level controlled Vital Signs Assessment: post-procedure vital signs reviewed and stable Respiratory status: spontaneous breathing, nonlabored ventilation, respiratory function stable and patient connected to nasal cannula oxygen Cardiovascular status: stable and blood pressure returned to baseline Postop Assessment: no apparent nausea or vomiting Anesthetic complications: no   There were no known notable events for this encounter.   Last Vitals:  Vitals:   12/03/23 0840 12/03/23 0945  BP: (!) 155/99 (!) 149/78  Pulse: 73 76  Resp: 17 16  Temp:  36.6 C  SpO2: (!) 14% 100%    Last Pain:  Vitals:   12/03/23 0945  TempSrc: Oral  PainSc: 0-No pain                 Johney Perotti L Dennys Guin

## 2023-12-06 ENCOUNTER — Encounter (HOSPITAL_COMMUNITY): Payer: Self-pay | Admitting: Ophthalmology

## 2024-01-25 DIAGNOSIS — E1169 Type 2 diabetes mellitus with other specified complication: Secondary | ICD-10-CM | POA: Diagnosis not present

## 2024-01-25 DIAGNOSIS — E782 Mixed hyperlipidemia: Secondary | ICD-10-CM | POA: Diagnosis not present

## 2024-01-25 DIAGNOSIS — E039 Hypothyroidism, unspecified: Secondary | ICD-10-CM | POA: Diagnosis not present

## 2024-01-31 DIAGNOSIS — T466X5A Adverse effect of antihyperlipidemic and antiarteriosclerotic drugs, initial encounter: Secondary | ICD-10-CM | POA: Diagnosis not present

## 2024-01-31 DIAGNOSIS — E782 Mixed hyperlipidemia: Secondary | ICD-10-CM | POA: Diagnosis not present

## 2024-01-31 DIAGNOSIS — Z0000 Encounter for general adult medical examination without abnormal findings: Secondary | ICD-10-CM | POA: Diagnosis not present

## 2024-01-31 DIAGNOSIS — E1169 Type 2 diabetes mellitus with other specified complication: Secondary | ICD-10-CM | POA: Diagnosis not present

## 2024-01-31 DIAGNOSIS — M8580 Other specified disorders of bone density and structure, unspecified site: Secondary | ICD-10-CM | POA: Diagnosis not present

## 2024-01-31 DIAGNOSIS — M797 Fibromyalgia: Secondary | ICD-10-CM | POA: Diagnosis not present

## 2024-01-31 DIAGNOSIS — E039 Hypothyroidism, unspecified: Secondary | ICD-10-CM | POA: Diagnosis not present

## 2024-01-31 DIAGNOSIS — I10 Essential (primary) hypertension: Secondary | ICD-10-CM | POA: Diagnosis not present

## 2024-01-31 DIAGNOSIS — I2510 Atherosclerotic heart disease of native coronary artery without angina pectoris: Secondary | ICD-10-CM | POA: Diagnosis not present

## 2024-02-02 DIAGNOSIS — M533 Sacrococcygeal disorders, not elsewhere classified: Secondary | ICD-10-CM | POA: Diagnosis not present

## 2024-02-10 DIAGNOSIS — Z961 Presence of intraocular lens: Secondary | ICD-10-CM | POA: Diagnosis not present

## 2024-02-10 DIAGNOSIS — H02831 Dermatochalasis of right upper eyelid: Secondary | ICD-10-CM | POA: Diagnosis not present

## 2024-02-10 DIAGNOSIS — Z9841 Cataract extraction status, right eye: Secondary | ICD-10-CM | POA: Diagnosis not present

## 2024-02-10 DIAGNOSIS — H43812 Vitreous degeneration, left eye: Secondary | ICD-10-CM | POA: Diagnosis not present

## 2024-05-26 DIAGNOSIS — M8580 Other specified disorders of bone density and structure, unspecified site: Secondary | ICD-10-CM | POA: Diagnosis not present

## 2024-05-26 DIAGNOSIS — E782 Mixed hyperlipidemia: Secondary | ICD-10-CM | POA: Diagnosis not present

## 2024-05-26 DIAGNOSIS — E1169 Type 2 diabetes mellitus with other specified complication: Secondary | ICD-10-CM | POA: Diagnosis not present

## 2024-05-26 DIAGNOSIS — E039 Hypothyroidism, unspecified: Secondary | ICD-10-CM | POA: Diagnosis not present

## 2024-06-01 DIAGNOSIS — E782 Mixed hyperlipidemia: Secondary | ICD-10-CM | POA: Diagnosis not present

## 2024-06-01 DIAGNOSIS — M797 Fibromyalgia: Secondary | ICD-10-CM | POA: Diagnosis not present

## 2024-06-01 DIAGNOSIS — E1165 Type 2 diabetes mellitus with hyperglycemia: Secondary | ICD-10-CM | POA: Diagnosis not present

## 2024-06-01 DIAGNOSIS — I10 Essential (primary) hypertension: Secondary | ICD-10-CM | POA: Diagnosis not present

## 2024-06-01 DIAGNOSIS — I2510 Atherosclerotic heart disease of native coronary artery without angina pectoris: Secondary | ICD-10-CM | POA: Diagnosis not present

## 2024-06-01 DIAGNOSIS — T466X5A Adverse effect of antihyperlipidemic and antiarteriosclerotic drugs, initial encounter: Secondary | ICD-10-CM | POA: Diagnosis not present

## 2024-06-01 DIAGNOSIS — E039 Hypothyroidism, unspecified: Secondary | ICD-10-CM | POA: Diagnosis not present

## 2024-06-01 DIAGNOSIS — K219 Gastro-esophageal reflux disease without esophagitis: Secondary | ICD-10-CM | POA: Diagnosis not present

## 2024-06-01 DIAGNOSIS — E1169 Type 2 diabetes mellitus with other specified complication: Secondary | ICD-10-CM | POA: Diagnosis not present

## 2024-06-05 ENCOUNTER — Other Ambulatory Visit (HOSPITAL_COMMUNITY): Payer: Self-pay | Admitting: Internal Medicine

## 2024-06-05 DIAGNOSIS — Z780 Asymptomatic menopausal state: Secondary | ICD-10-CM

## 2024-06-12 ENCOUNTER — Ambulatory Visit (HOSPITAL_COMMUNITY)
Admission: RE | Admit: 2024-06-12 | Discharge: 2024-06-12 | Disposition: A | Discharge status: Home / Self Care | Source: Ambulatory Visit | Attending: Internal Medicine | Admitting: Internal Medicine

## 2024-06-12 DIAGNOSIS — Z780 Asymptomatic menopausal state: Secondary | ICD-10-CM | POA: Insufficient documentation

## 2024-06-12 DIAGNOSIS — M8580 Other specified disorders of bone density and structure, unspecified site: Secondary | ICD-10-CM | POA: Diagnosis present

## 2024-06-16 DIAGNOSIS — R399 Unspecified symptoms and signs involving the genitourinary system: Secondary | ICD-10-CM | POA: Diagnosis not present

## 2024-06-16 DIAGNOSIS — N898 Other specified noninflammatory disorders of vagina: Secondary | ICD-10-CM | POA: Diagnosis not present

## 2024-06-16 DIAGNOSIS — N390 Urinary tract infection, site not specified: Secondary | ICD-10-CM | POA: Diagnosis not present

## 2024-06-19 DIAGNOSIS — B9689 Other specified bacterial agents as the cause of diseases classified elsewhere: Secondary | ICD-10-CM | POA: Diagnosis not present

## 2024-06-19 DIAGNOSIS — N760 Acute vaginitis: Secondary | ICD-10-CM | POA: Diagnosis not present

## 2024-07-12 DIAGNOSIS — E119 Type 2 diabetes mellitus without complications: Secondary | ICD-10-CM | POA: Diagnosis not present

## 2024-07-19 DIAGNOSIS — T3695XA Adverse effect of unspecified systemic antibiotic, initial encounter: Secondary | ICD-10-CM | POA: Diagnosis not present

## 2024-07-19 DIAGNOSIS — N760 Acute vaginitis: Secondary | ICD-10-CM | POA: Diagnosis not present

## 2024-07-19 DIAGNOSIS — B9689 Other specified bacterial agents as the cause of diseases classified elsewhere: Secondary | ICD-10-CM | POA: Diagnosis not present

## 2024-07-19 DIAGNOSIS — N898 Other specified noninflammatory disorders of vagina: Secondary | ICD-10-CM | POA: Diagnosis not present

## 2024-07-19 DIAGNOSIS — B379 Candidiasis, unspecified: Secondary | ICD-10-CM | POA: Diagnosis not present

## 2024-08-21 ENCOUNTER — Other Ambulatory Visit: Payer: Self-pay | Admitting: Physical Medicine and Rehabilitation

## 2024-08-21 DIAGNOSIS — M5459 Other low back pain: Secondary | ICD-10-CM

## 2024-09-12 ENCOUNTER — Ambulatory Visit
Admission: RE | Admit: 2024-09-12 | Discharge: 2024-09-12 | Disposition: A | Source: Ambulatory Visit | Attending: Physical Medicine and Rehabilitation | Admitting: Physical Medicine and Rehabilitation

## 2024-09-12 DIAGNOSIS — M5459 Other low back pain: Secondary | ICD-10-CM

## 2024-09-20 ENCOUNTER — Ambulatory Visit: Admitting: Adult Health

## 2024-09-21 ENCOUNTER — Other Ambulatory Visit (HOSPITAL_COMMUNITY)
Admission: RE | Admit: 2024-09-21 | Discharge: 2024-09-21 | Disposition: A | Source: Ambulatory Visit | Attending: Adult Health | Admitting: Adult Health

## 2024-09-21 ENCOUNTER — Ambulatory Visit: Admitting: Adult Health

## 2024-09-21 ENCOUNTER — Encounter: Payer: Self-pay | Admitting: Adult Health

## 2024-09-21 VITALS — BP 132/79 | HR 82 | Ht 65.0 in | Wt 199.0 lb

## 2024-09-21 DIAGNOSIS — N9489 Other specified conditions associated with female genital organs and menstrual cycle: Secondary | ICD-10-CM | POA: Diagnosis present

## 2024-09-21 DIAGNOSIS — I10 Essential (primary) hypertension: Secondary | ICD-10-CM | POA: Diagnosis not present

## 2024-09-21 DIAGNOSIS — L2989 Other pruritus: Secondary | ICD-10-CM

## 2024-09-21 DIAGNOSIS — Z90710 Acquired absence of both cervix and uterus: Secondary | ICD-10-CM

## 2024-09-21 DIAGNOSIS — N898 Other specified noninflammatory disorders of vagina: Secondary | ICD-10-CM | POA: Insufficient documentation

## 2024-09-21 LAB — POCT URINALYSIS DIPSTICK
Blood, UA: NEGATIVE
Glucose, UA: POSITIVE — AB
Ketones, UA: NEGATIVE
Nitrite, UA: NEGATIVE
Protein, UA: NEGATIVE

## 2024-09-21 MED ORDER — FLUCONAZOLE 150 MG PO TABS: ORAL_TABLET | ORAL | 1 refills | Status: DC

## 2024-09-21 NOTE — Progress Notes (Signed)
" °  Subjective:     Patient ID: Rebecca Burke, female   DOB: October 23, 1955, 69 y.o.   MRN: [id number]  HPI [name] is a 82 year year old white female, married, sp hysterectomy in complaining of vaginal burning and itching since October, has tried cipro, flagyl and diflucan  without relief.  PCP is Dr Shona  Review of Systems +vaginal burning and itching since October, has tried cipro, flagyl and diflucan  without relief.   Not having sex Reviewed past medical,surgical, social and family history. Reviewed medications and allergies.  Objective:   Physical Exam BP 132/79 (BP Location: Left Arm, Patient Position: Sitting, Cuff Size: Normal)   Pulse 82   Ht 5' 5 (1.651 m)   Wt 199 lb (90.3 kg)   BMI 33.12 kg/m   urine dipstick 2+glucose and trace leuks Skin warm and dry.Pelvic: external genitalia is normal in appearance, the inner labia are red,vagina: white discharge without odor,urethra has no lesions or masses noted, cervix and uterus are absent,  adnexa: no masses or tenderness noted. Bladder is non tender and no masses felt. CV swab obtained. Painted inner labia with gentian violet.  Fall risk is low  Upstream - 09/21/24 1455       Pregnancy Intention Screening   Does the patient want to become pregnant in the next year? N/A    Does the patient's partner want to become pregnant in the next year? N/A    Would the patient like to discuss contraceptive options today? N/A      Contraception Wrap Up   Current Method Hysterectomy    End Method Hysterectomy    Contraception Counseling Provided No         Examination chaperoned by Clarita Salt LPN     Assessment:     1. Itching in the vaginal area (Primary) +vaginal burning and itching since October, Painted with gentian violet and will rx diflucan  Get squeeze bottle and rinse off with warm water  after peeing and pat dry Meds ordered this encounter  Medications   fluconazole  (DIFLUCAN ) 150 MG tablet    Sig: Take 1 now and repeat 1  in 3 days    Dispense:  2 tablet    Refill:  1    Supervising Provider:   JAYNE MINDER H [[id number]]   CV swab sent  - Cervicovaginal ancillary only( [facility])  2. Vaginal burning CV swab sent for BV and yeast  - Cervicovaginal ancillary only( [place])  3. Hypertension, unspecified type Continue BP meds and follow up with PCP  4. S/P hysterectomy     Plan:    Urine sent for UA C&S  to rule out UTI  Follow up 09/29/24 for recheck     "

## 2024-09-21 NOTE — Addendum Note (Signed)
 Addended by: NEYSA CLARITA RAMAN on: 09/21/2024 03:44 PM   Modules accepted: Orders

## 2024-09-22 LAB — CERVICOVAGINAL ANCILLARY ONLY
Bacterial Vaginitis (gardnerella): POSITIVE — AB
Candida Glabrata: POSITIVE — AB
Candida Vaginitis: NEGATIVE
Comment: NEGATIVE
Comment: NEGATIVE
Comment: NEGATIVE

## 2024-09-23 LAB — URINALYSIS, ROUTINE W REFLEX MICROSCOPIC
Bilirubin, UA: NEGATIVE
Ketones, UA: NEGATIVE
Nitrite, UA: NEGATIVE
Protein,UA: NEGATIVE
RBC, UA: NEGATIVE
Urobilinogen, Ur: 0.2 mg/dL (ref 0.2–1.0)
pH, UA: 5 (ref 5.0–7.5)

## 2024-09-23 LAB — MICROSCOPIC EXAMINATION
Bacteria, UA: NONE SEEN
Casts: NONE SEEN /LPF
Epithelial Cells (non renal): >10 /HPF — AB (ref 0–10)
WBC, UA: >30 /HPF — AB (ref 0–5)

## 2024-09-24 LAB — URINE CULTURE

## 2024-09-26 ENCOUNTER — Ambulatory Visit: Payer: Self-pay | Admitting: Adult Health

## 2024-09-26 MED ORDER — METRONIDAZOLE 500 MG PO TABS: 500.0000 mg | ORAL_TABLET | Freq: Two times a day (BID) | ORAL | 0 refills | Status: AC

## 2024-09-27 ENCOUNTER — Other Ambulatory Visit: Payer: Self-pay | Admitting: Adult Health

## 2024-09-29 ENCOUNTER — Encounter: Payer: Self-pay | Admitting: Adult Health

## 2024-09-29 ENCOUNTER — Ambulatory Visit: Admitting: Adult Health

## 2024-09-29 VITALS — BP 120/76 | HR 89 | Ht 65.0 in | Wt 199.0 lb

## 2024-09-29 DIAGNOSIS — Z90710 Acquired absence of both cervix and uterus: Secondary | ICD-10-CM | POA: Diagnosis not present

## 2024-09-29 DIAGNOSIS — L2989 Other pruritus: Secondary | ICD-10-CM | POA: Diagnosis not present

## 2024-09-29 DIAGNOSIS — N898 Other specified noninflammatory disorders of vagina: Secondary | ICD-10-CM

## 2024-09-29 DIAGNOSIS — N9489 Other specified conditions associated with female genital organs and menstrual cycle: Secondary | ICD-10-CM

## 2024-09-29 MED ORDER — NYSTATIN-TRIAMCINOLONE 100000-0.1 UNIT/GM-% EX OINT: TOPICAL_OINTMENT | CUTANEOUS | 1 refills | Status: AC

## 2024-09-29 NOTE — Progress Notes (Signed)
" °  Subjective:     Patient ID: Rebecca Burke, female   DOB: June 30, 1956, 69 y.o.   MRN: [id number]  HPI Rebecca Burke is a 69 year old white female, married, sp hysterectomy back in follow up in vaginal burning and itching and is better, still has some itching outside. She was treated for yeast and BV 09/21/24, with Gentian violet, diflucan  and flagyl .   PCP is Dr Shona  Review of Systems Itching is better Reviewed past medical,surgical, social and family history. Reviewed medications and allergies.     Objective:   Physical Exam BP 120/76 (BP Location: Right Arm, Patient Position: Sitting, Cuff Size: Normal)   Pulse 89   Ht 5' 5 (1.651 m)   Wt 199 lb (90.3 kg)   BMI 33.12 kg/m     Skin warm and dry.Pelvic: external genitalia is normal in appearance no lesions, redness has decreased inner labia.  Upstream - 09/29/24 1025       Pregnancy Intention Screening   Does the patient want to become pregnant in the next year? N/A    Does the patient's partner want to become pregnant in the next year? N/A    Would the patient like to discuss contraceptive options today? N/A      Contraception Wrap Up   Current Method Hysterectomy    End Method Hysterectomy    Contraception Counseling Provided No         Examination chaperoned by Clarita Salt   Assessment:     1. Itching in the vaginal area (Primary) She is better, but still mild itching at times Will rx mytrex to use on external tissue if needed  Meds ordered this encounter  Medications   nystatin -triamcinolone  ointment (MYCOLOG)    Sig: Apply bid to external tissue as needed for itching    Dispense:  30 g    Refill:  1    Supervising Provider:   JAYNE MINDER H [[id number]]   Continue using peri bottle when goes to bathroom   2. Vaginal burning Resolved  3. S/P hysterectomy     Plan:     Follow up prn     "
